# Patient Record
Sex: Male | Born: 1990 | Race: White | Hispanic: No | Marital: Married | Smoking: Never smoker
Health system: Southern US, Community
[De-identification: ages and names within clinical notes are randomized; demographics above are authoritative.]

## PROBLEM LIST (undated history)

## (undated) DIAGNOSIS — K6289 Other specified diseases of anus and rectum: Secondary | ICD-10-CM

## (undated) DIAGNOSIS — J45909 Unspecified asthma, uncomplicated: Secondary | ICD-10-CM

## (undated) DIAGNOSIS — K625 Hemorrhage of anus and rectum: Secondary | ICD-10-CM

## (undated) HISTORY — PX: ADENOIDECTOMY: SUR15

## (undated) HISTORY — PX: ANKLE SURGERY: SHX546

---
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
## 2008-06-23 ENCOUNTER — Encounter (INDEPENDENT_AMBULATORY_CARE_PROVIDER_SITE_OTHER): Payer: Self-pay | Admitting: *Deleted

## 2008-06-23 ENCOUNTER — Ambulatory Visit: Payer: Self-pay | Admitting: Internal Medicine

## 2008-06-23 ENCOUNTER — Observation Stay (HOSPITAL_COMMUNITY): Admission: AD | Admit: 2008-06-23 | Discharge: 2008-06-24 | Payer: Self-pay | Admitting: Internal Medicine

## 2008-07-01 ENCOUNTER — Telehealth: Payer: Self-pay | Admitting: Internal Medicine

## 2008-07-02 ENCOUNTER — Encounter: Payer: Self-pay | Admitting: Internal Medicine

## 2009-12-29 ENCOUNTER — Ambulatory Visit: Payer: Self-pay | Admitting: Internal Medicine

## 2009-12-29 DIAGNOSIS — R5383 Other fatigue: Secondary | ICD-10-CM

## 2009-12-29 DIAGNOSIS — R040 Epistaxis: Secondary | ICD-10-CM | POA: Insufficient documentation

## 2009-12-29 DIAGNOSIS — R5381 Other malaise: Secondary | ICD-10-CM | POA: Insufficient documentation

## 2009-12-29 DIAGNOSIS — K921 Melena: Secondary | ICD-10-CM | POA: Insufficient documentation

## 2009-12-30 ENCOUNTER — Encounter (INDEPENDENT_AMBULATORY_CARE_PROVIDER_SITE_OTHER): Payer: Self-pay | Admitting: *Deleted

## 2009-12-30 LAB — CONVERTED CEMR LAB
Albumin: 4.7 g/dL (ref 3.5–5.2)
Alkaline Phosphatase: 48 units/L (ref 39–117)
Basophils Relative: 0.5 % (ref 0.0–3.0)
Bilirubin, Direct: 0.1 mg/dL (ref 0.0–0.3)
CO2: 31 meq/L (ref 19–32)
Calcium: 9.7 mg/dL (ref 8.4–10.5)
Chloride: 103 meq/L (ref 96–112)
Eosinophils Absolute: 0.1 10*3/uL (ref 0.0–0.7)
Eosinophils Relative: 2.6 % (ref 0.0–5.0)
Glucose, Bld: 97 mg/dL (ref 70–99)
HCT: 42.3 % (ref 39.0–52.0)
Hemoglobin, Urine: NEGATIVE
Ketones, ur: NEGATIVE mg/dL
Leukocytes, UA: NEGATIVE
Lymphocytes Relative: 40.4 % (ref 12.0–46.0)
MCHC: 34.5 g/dL (ref 30.0–36.0)
MCV: 91.1 fL (ref 78.0–100.0)
Monocytes Relative: 5.8 % (ref 3.0–12.0)
Neutro Abs: 2.1 10*3/uL (ref 1.4–7.7)
Platelets: 158 10*3/uL (ref 150.0–400.0)
Potassium: 4.3 meq/L (ref 3.5–5.1)
Sed Rate: 5 mm/hr (ref 0–22)
TSH: 1.66 microintl units/mL (ref 0.35–5.50)
Total Bilirubin: 1.1 mg/dL (ref 0.3–1.2)
Transferrin: 269.4 mg/dL (ref 212.0–360.0)
Vit D, 25-Hydroxy: 27 ng/mL — ABNORMAL LOW (ref 30–89)
Vitamin B-12: 343 pg/mL (ref 211–911)
WBC: 4.1 10*3/uL — ABNORMAL LOW (ref 4.5–10.5)
aPTT: 39.9 s — ABNORMAL HIGH (ref 21.7–28.8)

## 2009-12-31 ENCOUNTER — Encounter: Payer: Self-pay | Admitting: Internal Medicine

## 2009-12-31 ENCOUNTER — Ambulatory Visit: Payer: Self-pay | Admitting: Internal Medicine

## 2009-12-31 LAB — CONVERTED CEMR LAB: INR: 1.1 — ABNORMAL HIGH (ref 0.8–1.0)

## 2010-01-01 ENCOUNTER — Telehealth: Payer: Self-pay | Admitting: Internal Medicine

## 2010-01-04 ENCOUNTER — Telehealth: Payer: Self-pay | Admitting: Internal Medicine

## 2010-01-05 ENCOUNTER — Ambulatory Visit: Payer: Self-pay | Admitting: Oncology

## 2010-01-06 ENCOUNTER — Ambulatory Visit: Payer: Self-pay | Admitting: Internal Medicine

## 2010-01-06 DIAGNOSIS — G4700 Insomnia, unspecified: Secondary | ICD-10-CM | POA: Insufficient documentation

## 2010-01-06 DIAGNOSIS — R55 Syncope and collapse: Secondary | ICD-10-CM | POA: Insufficient documentation

## 2010-01-21 ENCOUNTER — Encounter: Payer: Self-pay | Admitting: Internal Medicine

## 2010-01-21 LAB — CBC WITH DIFFERENTIAL/PLATELET
HCT: 42.4 % (ref 38.4–49.9)
HGB: 14.8 g/dL (ref 13.0–17.1)
MCH: 31.4 pg (ref 27.2–33.4)
MONO%: 7.9 % (ref 0.0–14.0)
RDW: 12.7 % (ref 11.0–14.6)
WBC: 4.3 10*3/uL (ref 4.0–10.3)
lymph#: 1.8 10*3/uL (ref 0.9–3.3)

## 2010-01-21 LAB — MORPHOLOGY: PLT EST: ADEQUATE

## 2010-01-22 LAB — MONONUCLEOSIS SCREEN: Mono Screen: NEGATIVE

## 2010-01-26 LAB — COMPREHENSIVE METABOLIC PANEL
Alkaline Phosphatase: 57 U/L (ref 39–117)
BUN: 13 mg/dL (ref 6–23)
CO2: 21 mEq/L (ref 19–32)
Chloride: 105 mEq/L (ref 96–112)
Glucose, Bld: 94 mg/dL (ref 70–99)
Potassium: 4.1 mEq/L (ref 3.5–5.3)
Sodium: 140 mEq/L (ref 135–145)
Total Bilirubin: 0.9 mg/dL (ref 0.3–1.2)

## 2010-01-26 LAB — PTT FACTOR INHIBITOR (MIXING STUDY): PTT: 31 seconds (ref 24–37)

## 2010-01-26 LAB — FIBRINOGEN: Fibrinogen: 219 mg/dL (ref 204–475)

## 2010-01-26 LAB — THROMBIN TIME: THROMBIN TIME: 20 s (ref 16–23)

## 2010-02-22 ENCOUNTER — Telehealth: Payer: Self-pay | Admitting: Internal Medicine

## 2010-02-24 ENCOUNTER — Encounter: Payer: Self-pay | Admitting: Internal Medicine

## 2010-09-28 NOTE — Letter (Signed)
Summary: [facility] Consult Scheduled Letter  [facility]  [address] [place], [place] [address]   Phone: [phone]  Fax: [phone]      12/30/2009 MRN: [id number]  [name] [name] [address] [place], [place]  [address]    Dear [name],      We have scheduled an appointment for you.  At the recommendation of Dr.Plotnikov, we have scheduled you a consult with Dr Marina Goodell on 01/27/10 at 1:45pm.  Their phone number is [phone].  If this appointment day and time is not convenient for you, please feel free to call the office of the doctor you are being referred to at the number listed above and reschedule the appointment.     [facility] [address] [place], [place] [address] *Gastroenterology Dept.3rd Floor*    Please give 24hr notice if you need to cancel/reschedule to avoid a $50.00 fee.Also bring insurance card and any co-pay due at time of visit.    Thank you,  Patient Care Coordinator [facility]

## 2010-09-28 NOTE — Letter (Signed)
Summary: [employer]  [employer]   Imported By: Sherian Rein 01/12/2010 07:54:06  _____________________________________________________________________  External Attachment:    Type:   Image     Comment:   External Document

## 2010-09-28 NOTE — Assessment & Plan Note (Signed)
Summary: rectal bleeding, abd pain/#/cd   Vital Signs:  Patient profile:   20 year old male Height:      71 inches Weight:      162.38 pounds (73.81 kg) BMI:     22.73 O2 Sat:      98 % on Room air Temp:     98.4 degrees F (36.89 degrees C) oral Pulse rate:   52 / minute BP sitting:   112 / 72  (left arm) Cuff size:   regular  Vitals Entered By: Brenton Grills (Dec 29, 2009 10:46 AM)  O2 Flow:  Room air CC: pt c/o rectal bleeding, abdominal pain, chest pain, HA and earaches x 32months/pt also c/o nosebleeds that vary/aj   CC:  pt c/o rectal bleeding, abdominal pain, chest pain, and HA and earaches x 103months/pt also c/o nosebleeds that vary/aj.  History of Present Illness: C/o HAs and ear pain, nose bleeds, stomach hurts  x 2 wks BMs after each meal, mixed w/blood x 6-7 months No wt  loss  C/p weakness, cold sweats  Current Medications (verified): 1)  None  Allergies (verified): 1)  ! Penicillin 2)  ! Benadryl 3)  ! Ceclor 4)  ! * Advair  Past History:  Past Medical History: Last updated: 05/24/2007 Allergic Rhinitis Asthma  Past Surgical History: Last updated: 06/23/2008 Tonsillectomy  Social History: Last updated: 12/29/2009 Never Smoked Alcohol use-no Drug use-no Regular exercise-yes Occupation: Designer, fashion/clothing  Social History: Never Smoked Alcohol use-no Drug use-no Regular exercise-yes Occupation: Designer, fashion/clothing  Review of Systems       The patient complains of melena, severe indigestion/heartburn, genital sores, muscle weakness, suspicious skin lesions, difficulty walking, depression, and unusual weight change.  The patient denies fever, weight loss, chest pain, dyspnea on exertion, abdominal pain, and hematochezia.    Physical Exam  General:  alert, well-developed, well-nourished, and well-hydrated.   Nose:  Eryth mucosa w/yellow crusts Mouth:  Oral mucosa and oropharynx without lesions or exudates.  Teeth in good repair. Neck:  No deformities, masses,  or tenderness noted. Lungs:  Normal respiratory effort, chest expands symmetrically. Lungs are clear to auscultation, no crackles or wheezes. Heart:  Normal rate and regular rhythm. S1 and S2 normal without gallop, murmur, click, rub or other extra sounds. Abdomen:  soft, non-tender, no masses, no hepatomegaly, and no splenomegaly.   Rectal:  Def to GI Msk:  No deformity or scoliosis noted of thoracic or lumbar spine.   Pulses:  R and L carotid,radial,femoral,dorsalis pedis and posterior tibial pulses are full and equal bilaterally Extremities:  No clubbing, cyanosis, edema, or deformity noted with normal full range of motion of all joints.   Neurologic:  No cranial nerve deficits noted. Station and gait are normal. Plantar reflexes are down-going bilaterally. DTRs are symmetrical throughout. Sensory, motor and coordinative functions appear intact. Skin:  Intact without suspicious lesions or rashes Cervical Nodes:  No lymphadenopathy noted Inguinal Nodes:  No significant adenopathy Psych:  Cognition and judgment appear intact. Alert and cooperative with normal attention span and concentration. No apparent delusions, illusions, hallucinations   Impression & Recommendations:  Problem # 1:  FATIGUE (ICD-780.79) Assessment New  Orders: T-Vitamin D (25-Hydroxy) (98119-14782) T-2 View CXR, Same Day (71020.5TC) TLB-PT (Protime) (85610-PTP) TLB-B12, Serum-Total ONLY (95621-H08) TLB-BMP (Basic Metabolic Panel-BMET) (80048-METABOL) TLB-CBC Platelet - w/Differential (85025-CBCD) TLB-Hepatic/Liver Function Pnl (80076-HEPATIC) TLB-IBC Pnl (Iron/FE;Transferrin) (83550-IBC) TLB-Sedimentation Rate (ESR) (85652-ESR) TLB-TSH (Thyroid Stimulating Hormone) (84443-TSH) TLB-Udip ONLY (81003-UDIP) TLB-PTT (85730-PTTL)  Problem # 2:  EPISTAXIS (ICD-784.7)- poss  sinusitis Assessment: New Zpac Orders: T-Vitamin D (25-Hydroxy) (16109-60454) T-2 View CXR, Same Day (71020.5TC) TLB-B12, Serum-Total ONLY  (09811-B14) TLB-BMP (Basic Metabolic Panel-BMET) (80048-METABOL) TLB-CBC Platelet - w/Differential (85025-CBCD) TLB-Hepatic/Liver Function Pnl (80076-HEPATIC) TLB-IBC Pnl (Iron/FE;Transferrin) (83550-IBC) TLB-Sedimentation Rate (ESR) (85652-ESR) TLB-TSH (Thyroid Stimulating Hormone) (84443-TSH) TLB-Udip ONLY (81003-UDIP) TLB-PTT (85730-PTTL)  Problem # 3:  HEMATOCHEZIA (ICD-578.1) Assessment: New R/o IBD Orders: T-Vitamin D (25-Hydroxy) (78295-62130) T-2 View CXR, Same Day (71020.5TC) Gastroenterology Referral (GI) TLB-B12, Serum-Total ONLY (86578-I69) TLB-BMP (Basic Metabolic Panel-BMET) (80048-METABOL) TLB-CBC Platelet - w/Differential (85025-CBCD) TLB-Hepatic/Liver Function Pnl (80076-HEPATIC) TLB-IBC Pnl (Iron/FE;Transferrin) (83550-IBC) TLB-Sedimentation Rate (ESR) (85652-ESR) TLB-TSH (Thyroid Stimulating Hormone) (84443-TSH) TLB-Udip ONLY (81003-UDIP) TLB-PTT (85730-PTTL)  Problem # 4:  Sweats Assessment: New Labs ordered  Complete Medication List: 1)  Anusol-hc 25 Mg Supp (Hydrocortisone acetate) .Marland Kitchen.. 1 pr two times a day for hemorrhoids 2)  Vitamin D 1000 Unit Tabs (Cholecalciferol) .Marland Kitchen.. 1 by mouth qd 3)  Zithromax Z-pak 250 Mg Tabs (Azithromycin) .... As dirrected  Patient Instructions: 1)  Please schedule a follow-up appointment in 2 months. Prescriptions: ZITHROMAX Z-PAK 250 MG TABS (AZITHROMYCIN) as dirrected  #1 x 0   Entered and Authorized by:   Tresa Garter MD   Signed by:   Tresa Garter MD on 12/29/2009   Method used:   Print then Give to Patient   RxID:   [id number] ANUSOL-HC 25 MG SUPP (HYDROCORTISONE ACETATE) 1 pr two times a day for hemorrhoids  #20 x 3   Entered and Authorized by:   Tresa Garter MD   Signed by:   Tresa Garter MD on 12/29/2009   Method used:   Print then Give to Patient   RxID:   [id number]

## 2010-09-28 NOTE — Progress Notes (Signed)
Summary: results  Phone Note Call from Patient Call back at [phone]   Caller: Mother-Tammy Summary of Call: Patient mother is calling for test results. Please advise. Initial call taken by: Lucious Groves,  Jan 01, 2010 3:03 PM  Follow-up for Phone Call        one was abnormal, one was normal, two are pending, the low level is fibrinogen and IF he is bleeding this may need to be corrected in the hospital with a transfusion of FFP Follow-up by: Etta Grandchild MD,  Jan 01, 2010 3:08 PM  Additional Follow-up for Phone Call Additional follow up Details #1::        Patient mother notified that tests are still pending and if he is bleeding he would need to go to hosp for transfusion. Additional Follow-up by: Lucious Groves,  Jan 01, 2010 4:15 PM

## 2010-09-28 NOTE — Assessment & Plan Note (Signed)
Summary: f/u labs results/continued dizzyness /SD   Vital Signs:  Patient profile:   20 year old male Height:      71 inches Weight:      160.50 pounds BMI:     22.47 O2 Sat:      98 % on Room air Temp:     97.8 degrees F oral Pulse rate:   58 / minute BP supine:   120 / 70 BP standing:   120 / 70 Cuff size:   regular  Vitals Entered By: Lucious Groves (Jan 06, 2010 8:46 AM)  O2 Flow:  Room air CC: F/U--Discuss lab results and continue dizziness./kb Is Patient Diabetic? No Pain Assessment Patient in pain? no        CC:  F/U--Discuss lab results and continue dizziness./kb.  History of Present Illness: C/o an episode of almost passing out yesturday at work 9 am: he felt weak and lightheaded and went down on his knees. He has been tired and lightheaded x 3 wks. No CP, SOB. He does not eat breakfast. His bleedings have stopped after his previous visit. C/o insomnia x 3 months - gets 2 h per night max; not depressed, stressed w/his girlfriend...  Current Medications (verified): 1)  None  Allergies (verified): 1)  ! Penicillin 2)  ! Benadryl 3)  ! Ceclor 4)  ! * Advair  Past History:  Past Medical History: Last updated: 05/24/2007 Allergic Rhinitis Asthma  Social History: Never Smoked Alcohol use-no Drug use-no Regular exercise-yes Occupation: textiles Single  Review of Systems  The patient denies fever, chest pain, dyspnea on exertion, peripheral edema, abdominal pain, melena, hematochezia, muscle weakness, difficulty walking, depression, and unusual weight change.    Physical Exam  General:  Well-developed,well-nourished,in no acute distress; alert,appropriate and cooperative throughout examination Head:  Normocephalic and atraumatic without obvious abnormalities. No apparent alopecia or balding. Nose:  Eryth mucosa w/yellow crusts Mouth:  Oral mucosa and oropharynx without lesions or exudates.  Teeth in good repair. Neck:  No deformities, masses, or  tenderness noted. Lungs:  Normal respiratory effort, chest expands symmetrically. Lungs are clear to auscultation, no crackles or wheezes. Heart:  Normal rate and regular rhythm. S1 and S2 normal without gallop, murmur, click, rub or other extra sounds. Abdomen:  soft, non-tender, no masses, no hepatomegaly, and no splenomegaly.   Msk:  No deformity or scoliosis noted of thoracic or lumbar spine.   Pulses:  R and L carotid,radial,femoral,dorsalis pedis and posterior tibial pulses are full and equal bilaterally Extremities:  No clubbing, cyanosis, edema, or deformity noted with normal full range of motion of all joints.   Neurologic:  No cranial nerve deficits noted. Station and gait are normal. Plantar reflexes are down-going bilaterally. DTRs are symmetrical throughout. Sensory, motor and coordinative functions appear intact. Skin:  Intact without suspicious lesions or rashes Psych:  Cognition and judgment appear intact. Alert and cooperative with normal attention span and concentration. No apparent delusions, illusions, hallucinationsnot suicidal.     Impression & Recommendations:  Problem # 1:  SYNCOPE (ICD-780.2) - near Assessment New Treat #2 Orders: EKG w/ Interpretation (93000) S brady  Problem # 2:  INSOMNIA, CHRONIC (ICD-307.42) Assessment: Deteriorated See "Patient Instructions".   Problem # 3:  EPISTAXIS (ICD-784.7) - stopped Assessment: Comment Only I'm not sure of etiology...  The labs were reviewed with the patient. He denies incidental ingestions  of potential poisons, etc.  ?autoimmune reaction... Hem consult is pending   Problem # 4:  HEMATOCHEZIA (ICD-578.1) - stopped  Assessment: Comment Only As above  Complete Medication List: 1)  Vitamin D 1000 Unit Tabs (Cholecalciferol) .Marland Kitchen.. 1 by mouth qd 2)  Zolpidem Tartrate 10 Mg Tabs (Zolpidem tartrate) .... 1/2-1 tab at bedtime as needed insomnia  Patient Instructions: 1)  Eat small breakfast, drink coffee before  work and at 10 am 2)  Get more sleep 3)  Call if you are not well 4)  Keep return office visit  Prescriptions: ZOLPIDEM TARTRATE 10 MG TABS (ZOLPIDEM TARTRATE) 1/2-1 tab at bedtime as needed insomnia  #30 x 2   Entered and Authorized by:   Tresa Garter MD   Signed by:   Tresa Garter MD on 01/06/2010   Method used:   Print then Give to Patient   RxID:   [id number]

## 2010-09-28 NOTE — Progress Notes (Signed)
Summary: results  Phone Note Call from Patient Call back at Home Phone [phone] Call back at or [phone]   Caller: Patient Call For: Etta Grandchild MD Summary of Call: Pt would like results of labs when they are available. Initial call taken by: Verdell Face,  Jan 04, 2010 3:12 PM  Follow-up for Phone Call        please send this to Dr. Demetrius Charity Follow-up by: Etta Grandchild MD,  Jan 04, 2010 3:23 PM  Additional Follow-up for Phone Call Additional follow up Details #1::        Mother called again, Pt is c/o dizzyness. Please advise.  Additional Follow-up by: Lamar Sprinkles, CMA,  Jan 05, 2010 10:30 AM    Additional Follow-up for Phone Call Additional follow up Details #2::    I called and left a vm with a notion that we'll be making a Hematol appt Follow-up by: Tresa Garter MD,  Jan 05, 2010 1:13 PM  Additional Follow-up for Phone Call Additional follow up Details #3:: Details for Additional Follow-up Action Taken: Spoke with pt's mother, She is concerned b/c pt c/o continued dizzyness and "blacked out" at work. I spoke w/pt and Dr Posey Rea. Pt c/o seeing "stars" when he bends over to pick up things at work. He will call office for new symptoms. Scheduled for office visit tomorrow. ................Marland KitchenLamar Sprinkles, CMA  Jan 05, 2010 3:10 PM  Agree. AP Additional Follow-up by: Tresa Garter MD,  Jan 05, 2010 4:45 PM

## 2010-09-28 NOTE — Progress Notes (Signed)
Summary: Company secretary - Asthma letter  Phone Note Call from Patient Call back at [employer] [phone] Call back at [phone]   Summary of Call: Patient is requesting a letter from MD. He is trying to enlist into the [employer]. He needs a letter stating that his asthma was a childhood illness and he is not affected by this now. MD is out of the office until monday. I left vm for pt to find out if he has a deadline for this.  Initial call taken by: Lamar Sprinkles, CMA,  February 22, 2010 4:06 PM  Follow-up for Phone Call        Spoke w/pt, He is aware MD is out of the office and would like to wait until MD returns for this letter.  Follow-up by: Lamar Sprinkles, CMA,  February 22, 2010 4:19 PM  Additional Follow-up for Phone Call Additional follow up Details #1::        ok Additional Follow-up by: Tresa Garter MD,  February 24, 2010 9:38 AM    Additional Follow-up for Phone Call Additional follow up Details #2::    letter stampted, and left vm for pt to pick up letter Follow-up by: Lamar Sprinkles, CMA,  February 24, 2010 10:09 AM

## 2010-09-28 NOTE — Letter (Signed)
[facility]   Imported By: Sherian Rein 03/03/2010 09:42:05  _____________________________________________________________________  External Attachment:    Type:   Image     Comment:   External Document

## 2010-09-28 NOTE — Letter (Signed)
Summary: Generic Letter  [facility]  [address] [place], [place] [address]   Phone: [phone]  Fax: [phone]    02/24/2010  BM:[id number] [address] [place], [place]  [address]    Mr. [name] [name] has a h/o childhood asthma that has resolved. There has been no asthmatic episodes since 2007.           Sincerely,   Jacinta Shoe MD

## 2011-01-14 NOTE — Assessment & Plan Note (Signed)
[facility]                             PRIMARY CARE OFFICE NOTE   NAME:[name] [name] SAFLEY                    MRN:          [id number]  DATE:05/10/2006                            DOB:          11-Mar-1991    The patient is a 20 year old male who presents with his mother for a  wellness examination.   ALLERGIES:  PENICILLIN, BENADRYL, CECLOR, ADVAIR.  LATELY, ALBUTEROL STARTED  TO MAKE HIM SHAKY AND DIZZY.   PAST MEDICAL HISTORY:  1. There is a history of asthma, severe in childhood, much better now.  2. Allergic rhinitis.  3. Torn ligament, left ankle, status post surgical repair.   CURRENT MEDICATIONS:  Allegra p.r.n.   SOCIAL HISTORY:  He is doing 11th grade a half day at home and a half day at  school.  He plays basketball and baseball.  He does not smoke.   FAMILY HISTORY:  Positive for asthma.   REVIEW OF SYSTEMS:  No chest pain.  No abdominal pain.  No emotional  problems.   PHYSICAL EXAMINATION:  GENERAL:  He looks well and is in no acute distress.  HEENT:  With moist mucosa.  VITAL SIGNS:  Blood pressure 119/59, pulse 57, temperature 97.6, weight 149  pounds, height 5 feet 10.75 inches.  NECK:  Supple.  No thyromegaly or bruit.  LUNGS:  Clear.  No wheeze or rales.  HEART:  No murmur or gallop.  ABDOMEN:  Soft, nontender.  No mass felt.  EXTREMITIES:  Lower extremities without edema.  NEUROLOGIC:  He is alert, oriented, and cooperative.  He denies being  depressed.  SKIN:  Clear.   ASSESSMENT AND PLAN:  1. Normal wellness examination.  Age/health-related issues discussed.      Lifestyle discussed.  No restrictions for basketball/baseball.  2. Asthma, well-controlled.  Will try Xopenex HFA two inhalations before      and after the game as needed.  3. Occasional gastroesophageal reflux disease.  Tums p.r.n.  4. Status post left ankle ligament repair.  He will use Ace wrap for a      brace for games.  5. Allergic rhinitis.   Allegra 180 daily as needed.                                    [name] Quint. Plotnikov, MD   AVP/MedQ  DD:  05/11/2006  DT:  05/12/2006  Job #:  [id number]

## 2011-05-31 LAB — COMPREHENSIVE METABOLIC PANEL
ALT: 17
BUN: 12
CO2: 29
Chloride: 103
GFR calc Af Amer: >60
Glucose, Bld: 103 — ABNORMAL HIGH
Potassium: 4.3
Total Bilirubin: 1.1
Total Protein: 6.8

## 2011-05-31 LAB — URINALYSIS, ROUTINE W REFLEX MICROSCOPIC
Glucose, UA: NEGATIVE
Hgb urine dipstick: NEGATIVE
Ketones, ur: NEGATIVE
Nitrite: NEGATIVE
Protein, ur: NEGATIVE
Urobilinogen, UA: 0.2

## 2011-06-09 LAB — URINE CULTURE
Colony Count: NO GROWTH
Culture: NO GROWTH

## 2011-06-09 LAB — URINALYSIS, ROUTINE W REFLEX MICROSCOPIC
Bilirubin Urine: NEGATIVE
Ketones, ur: NEGATIVE
Nitrite: NEGATIVE
pH: 5.5

## 2012-02-29 ENCOUNTER — Emergency Department (HOSPITAL_COMMUNITY)
Admission: EM | Admit: 2012-02-29 | Discharge: 2012-03-01 | Disposition: A | Discharge status: Home / Self Care | Payer: Self-pay | Attending: Emergency Medicine | Admitting: Emergency Medicine

## 2012-02-29 ENCOUNTER — Encounter (HOSPITAL_COMMUNITY): Payer: Self-pay | Admitting: *Deleted

## 2012-02-29 DIAGNOSIS — K6289 Other specified diseases of anus and rectum: Secondary | ICD-10-CM | POA: Insufficient documentation

## 2012-02-29 DIAGNOSIS — Z888 Allergy status to other drugs, medicaments and biological substances status: Secondary | ICD-10-CM | POA: Insufficient documentation

## 2012-02-29 DIAGNOSIS — K625 Hemorrhage of anus and rectum: Secondary | ICD-10-CM | POA: Insufficient documentation

## 2012-02-29 DIAGNOSIS — Z880 Allergy status to penicillin: Secondary | ICD-10-CM | POA: Insufficient documentation

## 2012-02-29 HISTORY — DX: Unspecified asthma, uncomplicated: J45.909

## 2012-02-29 LAB — CBC WITH DIFFERENTIAL/PLATELET
HCT: 43.3 % (ref 39.0–52.0)
MCH: 31.1 pg (ref 26.0–34.0)
MCHC: 34.9 g/dL (ref 30.0–36.0)
Monocytes Absolute: 0.4 10*3/uL (ref 0.1–1.0)
Neutro Abs: 2.6 10*3/uL (ref 1.7–7.7)
Platelets: 165 10*3/uL (ref 150–400)
RDW: 11.9 % (ref 11.5–15.5)
WBC: 5.4 10*3/uL (ref 4.0–10.5)

## 2012-02-29 LAB — BASIC METABOLIC PANEL
CO2: 29 mEq/L (ref 19–32)
Chloride: 102 mEq/L (ref 96–112)
Glucose, Bld: 94 mg/dL (ref 70–99)

## 2012-02-29 MED ORDER — OXYCODONE-ACETAMINOPHEN 5-325 MG PO TABS
1.0000 | ORAL_TABLET | Freq: Once | ORAL | Status: AC
  Administered 2012-02-29: 1 via ORAL
  Filled 2012-02-29: qty 1

## 2012-02-29 NOTE — ED Notes (Signed)
Pt is here for rectal bleeding.  Pt states that the bleeding began 8-9 months ago and pt was seen at the health department and told that he should be seen by a specialist but pt has been unable to follow up with this.  Pt also has been having rectal pain and pain with sitting.  Pt states that there is "a lot of bleeding" but blood counts have been normal when he was checked in the past. Intermittent general weakness and dizziness.

## 2012-02-29 NOTE — ED Provider Notes (Signed)
History     CSN: [id number]  Arrival date & time 02/29/12  2038   First MD Initiated Contact with Patient 02/29/12 2247      Chief Complaint  Patient presents with  . Rectal Bleeding    (Consider location/radiation/quality/duration/timing/severity/associated sxs/prior treatment) HPI Comments: Patient is a 21 year old male with no significant past medical history that presents emergency department with chief complaint of rectal bleeding and rectal pain.  Patient has been suffering from this intermittently beginning back around 8 or 9 months ago.  Patient states that he saw someone from the health Department about 4 months ago and was diagnosed with a cyst and recommended to followup with a specialist, however he reports that he's been trying to get appropriate paperwork and Medicaid however he has been unable to finance the specialist.  Patient denies any melena, nausea, vomiting, abdominal pain, fever, night sweats, chills, lightheadedness, syncope, easy. bruising.  Extreme pain with defecation and occasional gross blood.  Patient has no other complaints at this time.  Patient is a 21 y.o. male presenting with hematochezia. The history is provided by the patient.  Rectal Bleeding     Past Medical History  Diagnosis Date  . Asthma     Past Surgical History  Procedure Date  . Adenoidectomy   . Ankle surgery     No family history on file.  History  Substance Use Topics  . Smoking status: Not on file  . Smokeless tobacco: Not on file  . Alcohol Use: No      Review of Systems  Gastrointestinal: Positive for hematochezia.  All other systems reviewed and are negative.    Allergies  Cefaclor; Diphenhydramine hcl; Fluticasone-salmeterol; and Penicillins  Home Medications  No current outpatient prescriptions on file.  BP 111/53  Pulse 60  Temp 98.8 F (37.1 C) (Oral)  Resp 16  SpO2 98%  Physical Exam  Nursing note and vitals reviewed. Constitutional: He is  oriented to person, place, and time. He appears well-developed and well-nourished. No distress.  HENT:  Head: Normocephalic and atraumatic.  Eyes: Conjunctivae and EOM are normal.  Neck: Normal range of motion.  Pulmonary/Chest: Effort normal.  Genitourinary: Guaiac negative stool.       Chaperone was present. Patient with extreme pain during rectal exam. External ttp left of the anus.There are no external fissures noted. No induration of the skin or swelling. No external hemorrhoids seen. I was able to feel the first 2-3cm of the rectum digitally, palpable skin abnormality at 9:00 that was ttp without fluctuance. There is no gross blood.  No signs of perirectal abscess.    Musculoskeletal: Normal range of motion.  Neurological: He is alert and oriented to person, place, and time.  Skin: Skin is warm and dry. No rash noted. He is not diaphoretic.  Psychiatric: He has a normal mood and affect. His behavior is normal.    ED Course  Procedures (including critical care time)  Labs Reviewed  BASIC METABOLIC PANEL - Abnormal; Notable for the following:    Creatinine, Ser 1.78 (*)     GFR calc non Af Amer 53 (*)     GFR calc Af Amer 61 (*)     All other components within normal limits  CBC WITH DIFFERENTIAL   No results found.   No diagnosis found.    MDM  Rectal pain/bleeding  Patient advised again to followup with GI for further evaluation of rectal pain and bleeding.  Advised to take with stool softeners in  stool bulking agents (Colace, Metamucil).  Patient with normal vitals in no acute distress prior to discharge.        Jaci Carrel, [place] 03/01/12 [phone]

## 2012-03-01 MED ORDER — PSYLLIUM 0.52 G PO CAPS: 0.5200 g | ORAL_CAPSULE | Freq: Every day | ORAL | Status: DC

## 2012-03-01 MED ORDER — DOCUSATE SODIUM 100 MG PO CAPS: 100.0000 mg | ORAL_CAPSULE | Freq: Two times a day (BID) | ORAL | Status: AC

## 2012-03-01 MED ORDER — LIDOCAINE 5 % EX OINT
TOPICAL_OINTMENT | Freq: Once | CUTANEOUS | Status: DC
  Filled 2012-03-01: qty 35.44

## 2012-03-01 NOTE — ED Provider Notes (Signed)
History/physical exam/procedure(s) were performed by non-physician practitioner and as supervising physician I was immediately available for consultation/collaboration. I have reviewed all notes and am in agreement with care and plan.   Hilario Quarry, MD 03/01/12 [phone]

## 2012-03-15 ENCOUNTER — Other Ambulatory Visit (INDEPENDENT_AMBULATORY_CARE_PROVIDER_SITE_OTHER): Payer: Self-pay | Admitting: *Deleted

## 2012-03-15 ENCOUNTER — Ambulatory Visit (INDEPENDENT_AMBULATORY_CARE_PROVIDER_SITE_OTHER): Payer: Self-pay | Admitting: Internal Medicine

## 2012-03-15 ENCOUNTER — Encounter (INDEPENDENT_AMBULATORY_CARE_PROVIDER_SITE_OTHER): Payer: Self-pay | Admitting: Internal Medicine

## 2012-03-15 ENCOUNTER — Encounter (INDEPENDENT_AMBULATORY_CARE_PROVIDER_SITE_OTHER): Payer: Self-pay | Admitting: *Deleted

## 2012-03-15 VITALS — BP 100/60 | HR 76 | Temp 97.9°F | Ht 71.0 in | Wt 172.3 lb

## 2012-03-15 DIAGNOSIS — K6289 Other specified diseases of anus and rectum: Secondary | ICD-10-CM

## 2012-03-15 DIAGNOSIS — R6889 Other general symptoms and signs: Secondary | ICD-10-CM | POA: Insufficient documentation

## 2012-03-15 DIAGNOSIS — K625 Hemorrhage of anus and rectum: Secondary | ICD-10-CM | POA: Insufficient documentation

## 2012-03-15 NOTE — Patient Instructions (Addendum)
Flex sig tomorrow with sedation.

## 2012-03-15 NOTE — Progress Notes (Signed)
Subjective:     Patient ID: [name] [name], male   DOB: 07-15-1991, 21 y.o.   MRN: [id number]  HPI [name] [name] is a 21 yr old male here today after a recent admssion to the ED  (03/19/2012).  for rectal pain. He also had rectal bleeding.  He describes the blood as "alot"  He says that every times he has a BM he sees blood.  Symtoms x 8 or 9 months.  During rectal exam, an abnormality felt at 9:00.  There has been no weight loss. He has actually gained weight. He has rectal pain. No melena.  He has a BM about 3 times a day.  He gets up at night about 3 times to void.  He has pain when he has a BM.   Has tried Proctofoam but did not help.  OTC supp for hemorrhoids helped somewhat. CBC    Component Value Date/Time   WBC 5.4 02/29/2012 2112   WBC 4.3 01/21/2010 1213   RBC 4.85 02/29/2012 2112   RBC 4.71 01/21/2010 1213   HGB 15.1 02/29/2012 2112   HGB 14.8 01/21/2010 1213   HCT 43.3 02/29/2012 2112   HCT 42.4 01/21/2010 1213   PLT 165 02/29/2012 2112   PLT 150 01/21/2010 1213   MCV 89.3 02/29/2012 2112   MCV 90.1 01/21/2010 1213   MCH 31.1 02/29/2012 2112   MCH 31.4 01/21/2010 1213   MCHC 34.9 02/29/2012 2112   MCHC 34.8 01/21/2010 1213   RDW 11.9 02/29/2012 2112   RDW 12.7 01/21/2010 1213   LYMPHSABS 2.2 02/29/2012 2112   LYMPHSABS 1.8 01/21/2010 1213   MONOABS 0.4 02/29/2012 2112   MONOABS 0.3 01/21/2010 1213   EOSABS 0.3 02/29/2012 2112   EOSABS 0.1 01/21/2010 1213   BASOSABS 0.0 02/29/2012 2112   BASOSABS 0.0 01/21/2010 1213     Review of Systems see hpi Current Outpatient Prescriptions  Medication Sig Dispense Refill  . psyllium (METAMUCIL) 0.52 G capsule Take 1 capsule (0.52 g total) by mouth daily.  30 capsule  0   Past Medical History  Diagnosis Date  . Asthma   . Asthma    Past Surgical History  Procedure Date  . Adenoidectomy   . Ankle surgery    Family Status  Relation Status Death Age  . Mother Alive     good health  . Father Alive     hypertension  . Brother Alive     good health    History   Social History  . Marital Status: Married    Spouse Name: N/A    Number of Children: N/A  . Years of Education: N/A   Occupational History  . Not on file.   Social History Main Topics  . Smoking status: Never Smoker   . Smokeless tobacco: Not on file  . Alcohol Use: No  . Drug Use: Not on file  . Sexually Active: Not on file   Other Topics Concern  . Not on file   Social History Narrative  . No narrative on file   Allergies  Allergen Reactions  . Cefaclor   . Diphenhydramine Hcl   . Fluticasone-Salmeterol   . Penicillins         Objective:   Physical Exam Filed Vitals:   03/15/12 1529  Height: 5\' 11"  (1.803 m)  Weight: 172 lb 4.8 oz (78.155 kg)   Alert and oriented. Skin warm and dry. Oral mucosa is moist.   . Sclera anicteric, conjunctivae is pink. Thyroid  not enlarged. No cervical lymphadenopathy. Lungs clear. Heart regular rate and rhythm.  Abdomen is soft. Bowel sounds are positive. No hepatomegaly. No abdominal masses felt. No tenderness. Rectal exam: very painful for patient. Did not feel a mass  No stool to guaiac.  No edema to lower extremities.       Assessment:   Rectal bleeding, rectal pain. ?abnromal tissue on rectal exam per ED physician.  Possible rectal fissure. I discussed with Dr. Karilyn Cota.    Plan:    Flex sig with profonol.

## 2012-03-16 ENCOUNTER — Encounter (HOSPITAL_COMMUNITY): Payer: Self-pay | Admitting: *Deleted

## 2012-03-16 ENCOUNTER — Encounter (HOSPITAL_COMMUNITY)
Admission: RE | Disposition: A | Discharge status: Home Health | Payer: Self-pay | Source: Ambulatory Visit | Attending: Internal Medicine

## 2012-03-16 ENCOUNTER — Ambulatory Visit (HOSPITAL_COMMUNITY)
Admission: RE | Admit: 2012-03-16 | Discharge: 2012-03-16 | Disposition: A | Discharge status: Home Health | Payer: Self-pay | Source: Ambulatory Visit | Attending: Internal Medicine | Admitting: Internal Medicine

## 2012-03-16 DIAGNOSIS — K644 Residual hemorrhoidal skin tags: Secondary | ICD-10-CM

## 2012-03-16 DIAGNOSIS — K6289 Other specified diseases of anus and rectum: Secondary | ICD-10-CM

## 2012-03-16 DIAGNOSIS — K625 Hemorrhage of anus and rectum: Secondary | ICD-10-CM

## 2012-03-16 HISTORY — PX: FLEXIBLE SIGMOIDOSCOPY: SHX5431

## 2012-03-16 HISTORY — DX: Other specified diseases of anus and rectum: K62.89

## 2012-03-16 HISTORY — DX: Hemorrhage of anus and rectum: K62.5

## 2012-03-16 SURGERY — SIGMOIDOSCOPY, FLEXIBLE
Anesthesia: Moderate Sedation

## 2012-03-16 MED ORDER — LIDOCAINE HCL 2 % EX GEL
CUTANEOUS | Status: AC
  Filled 2012-03-16: qty 30

## 2012-03-16 MED ORDER — MEPERIDINE HCL 50 MG/ML IJ SOLN
INTRAMUSCULAR | Status: AC
  Filled 2012-03-16: qty 1

## 2012-03-16 MED ORDER — MIDAZOLAM HCL 5 MG/5ML IJ SOLN
INTRAMUSCULAR | Status: AC
  Filled 2012-03-16: qty 10

## 2012-03-16 MED ORDER — NYSTATIN-TRIAMCINOLONE 100000-0.1 UNIT/GM-% EX OINT: TOPICAL_OINTMENT | Freq: Two times a day (BID) | CUTANEOUS | Status: DC

## 2012-03-16 MED ORDER — MIDAZOLAM HCL 5 MG/5ML IJ SOLN
INTRAMUSCULAR | Status: DC | PRN
  Administered 2012-03-16 (×5): 2 mg via INTRAVENOUS

## 2012-03-16 MED ORDER — NITROGLYCERIN 0.4 % RE OINT: 0.5000 [in_us] | TOPICAL_OINTMENT | Freq: Two times a day (BID) | RECTAL | Status: DC

## 2012-03-16 MED ORDER — STERILE WATER FOR IRRIGATION IR SOLN
Status: DC | PRN
  Administered 2012-03-16: 12:00:00

## 2012-03-16 MED ORDER — LIDOCAINE 5 % EX OINT
TOPICAL_OINTMENT | CUTANEOUS | Status: DC | PRN
  Administered 2012-03-16: 2

## 2012-03-16 MED ORDER — MEPERIDINE HCL 25 MG/ML IJ SOLN
INTRAMUSCULAR | Status: DC | PRN
  Administered 2012-03-16 (×2): 25 mg via INTRAVENOUS

## 2012-03-16 MED ORDER — SODIUM CHLORIDE 0.45 % IV SOLN
Freq: Once | INTRAVENOUS | Status: AC
  Administered 2012-03-16: 12:00:00 via INTRAVENOUS

## 2012-03-16 MED ORDER — POLYETHYLENE GLYCOL 3350 17 G PO PACK: 17.0000 g | PACK | Freq: Every day | ORAL | Status: AC

## 2012-03-16 NOTE — Op Note (Signed)
FLEXIBLE SIGMOIDOSCOPY  PROCEDURE REPORT  PATIENT:  [name] [name]  MR#:  [id number] Birthdate:  1990-09-09, 21 y.o., male Endoscopist:  Dr. Malissa Hippo, MD Referred By:  Jaci Carrel, [facility] Procedure Date: 03/16/2012  Procedure: Flexible sigmoidoscopy.  Indications:  Patient is 21 year old Caucasian male with 10 month history of rectal pain, rectal bleeding and painful defecation.  Informed Consent:  The procedure and risks were reviewed with the patient and informed consent was obtained.  Medications:  Demerol 50 mg IV Versed 10 mg IV Xylocaine jelly for topical application anal canal.  Description of procedure:   Procedure performed in the endoscopy suite. Patient's vital signs and O2 sat were monitored during the procedure and remained stable. Patient was extremely anxious well sedated for the procedure. Patient was placed in left lateral common position. Rectal examination performed. No abnormality noted an external examination. Increased sphincter tone. Gastric BTO colonoscopethe rectum and advanced under vision and sigmoid colon a large amount of formed stool. Therefore examination is limited. Mucosa at distal sigmoid colon rectum as well as anorectal junction was carefully examined and scope was retroflexed.  Findings:   Sigmoid colon full of formed stool limited view of mucosa. Mucosa however was normal. Normal rectal mucosa. Noise below the dentate line with erythema but no ulcer or fissure noted.  Complications:  None  Impression:  No evidence of proctitis or rectal polyps. External hemorrhoids with inflammation but no fissure noted. Sigmoid colon full of formed stool.  Recommendations:  Nitroglycerin ointment 0.4% to be applied to anal canal twice a day. Mycolog-II ointment to be applied on the outside twice a day. Polyethylene glycol 17 g by mouth daily. Continue Metamucil. Progress report in one week. If symptoms persist he will need exam under  anesthesia.  Ebin Palazzi U  03/16/2012 12:32 PM  CC: Dr. No primary provider on file. & Dr. No ref. provider found

## 2012-03-16 NOTE — H&P (Signed)
[name] [name] is an 21 y.o. male.   Chief Complaint: [name] [name] is here for flexible sigmoidoscopy. HPI: [name] is 21 year old Caucasian male who presents with 10 month history of intermittent rectal pain and rectal bleeding. He said daily pain in source after bowel movement and pain may last all day. He denies diarrhea or constipation. Patient has been treated with topicals and antibiotics without symptomatic improvement. Patient was seen in emergency room 2 days ago and thought to have inflamed internal hemorrhoids. Patient has good appetite and that she gained few pounds. He does not take NSAIDs. Family history is negative for inflammatory bowel disease.  Past Medical History  Diagnosis Date  . Asthma   . Asthma   . Rectal pain   . Rectal bleeding     Past Surgical History  Procedure Date  . Adenoidectomy   . Ankle surgery     Family History  Problem Relation Age of Onset  . Colon cancer Paternal Uncle    Social History:  reports that he has never smoked. He does not have any smokeless tobacco history on file. He reports that he does not drink alcohol or use illicit drugs.  Allergies:  Allergies  Allergen Reactions  . Cefaclor   . Diphenhydramine Hcl   . Fluticasone-Salmeterol   . Penicillins     Medications Prior to Admission  Medication Sig Dispense Refill  . psyllium (METAMUCIL) 0.52 G capsule Take 1 capsule (0.52 g total) by mouth daily.  30 capsule  0    No results found for this or any previous visit (from the past 48 hour(s)). No results found.  ROS  Blood pressure 126/78, pulse 62, temperature 98.2 F (36.8 C), temperature source Oral, resp. rate 18, height 5\' 11"  (1.803 m), weight 172 lb (78.019 kg), SpO2 98.00%. Physical Exam  Constitutional: He appears well-developed and well-nourished.  HENT:  Mouth/Throat: Oropharynx is clear and moist.  Eyes: Conjunctivae are normal. No scleral icterus.  Neck: No thyromegaly present.  Cardiovascular: Normal rate,  regular rhythm and normal heart sounds.   No murmur heard. Respiratory: Effort normal.  GI: He exhibits no distension and no mass. There is no tenderness.  Musculoskeletal: He exhibits no edema.  Lymphadenopathy:    He has no cervical adenopathy.  Neurological: He is alert.  Skin: Skin is warm and dry.     Assessment/Plan Rectal pain and hematochezia. Flexible sigmoidoscopy.  [name] [name] U 03/16/2012, 12:01 PM

## 2012-03-22 ENCOUNTER — Encounter (HOSPITAL_COMMUNITY): Payer: Self-pay | Admitting: Internal Medicine

## 2013-01-02 ENCOUNTER — Emergency Department (HOSPITAL_COMMUNITY)
Admission: EM | Admit: 2013-01-02 | Discharge: 2013-01-02 | Disposition: A | Discharge status: Home / Self Care | Payer: Self-pay | Attending: Emergency Medicine | Admitting: Emergency Medicine

## 2013-01-02 ENCOUNTER — Emergency Department (HOSPITAL_COMMUNITY): Payer: Self-pay

## 2013-01-02 ENCOUNTER — Encounter (HOSPITAL_COMMUNITY): Payer: Self-pay | Admitting: Emergency Medicine

## 2013-01-02 DIAGNOSIS — Z880 Allergy status to penicillin: Secondary | ICD-10-CM | POA: Insufficient documentation

## 2013-01-02 DIAGNOSIS — R296 Repeated falls: Secondary | ICD-10-CM | POA: Insufficient documentation

## 2013-01-02 DIAGNOSIS — J45909 Unspecified asthma, uncomplicated: Secondary | ICD-10-CM | POA: Insufficient documentation

## 2013-01-02 DIAGNOSIS — Y9302 Activity, running: Secondary | ICD-10-CM | POA: Insufficient documentation

## 2013-01-02 DIAGNOSIS — Y929 Unspecified place or not applicable: Secondary | ICD-10-CM | POA: Insufficient documentation

## 2013-01-02 DIAGNOSIS — S93402A Sprain of unspecified ligament of left ankle, initial encounter: Secondary | ICD-10-CM

## 2013-01-02 DIAGNOSIS — Z8719 Personal history of other diseases of the digestive system: Secondary | ICD-10-CM | POA: Insufficient documentation

## 2013-01-02 DIAGNOSIS — S93409A Sprain of unspecified ligament of unspecified ankle, initial encounter: Secondary | ICD-10-CM | POA: Insufficient documentation

## 2013-01-02 MED ORDER — IBUPROFEN 400 MG PO TABS
800.0000 mg | ORAL_TABLET | Freq: Once | ORAL | Status: AC
  Administered 2013-01-02: 800 mg via ORAL

## 2013-01-02 MED ORDER — IBUPROFEN 400 MG PO TABS
800.0000 mg | ORAL_TABLET | Freq: Once | ORAL | Status: AC
  Filled 2013-01-02: qty 2

## 2013-01-02 NOTE — ED Notes (Signed)
MD at bedside./ PA

## 2013-01-02 NOTE — ED Notes (Signed)
Left foot pain since last night  Was running and landed on  Left ankle some swelling good pulse can wiggle toes

## 2013-01-02 NOTE — ED Notes (Signed)
Pt educated about walking with crutches

## 2013-01-02 NOTE — ED Notes (Signed)
Patient transported to X-ray 

## 2013-01-03 NOTE — ED Provider Notes (Signed)
History     CSN: [id number]  Arrival date & time 01/02/13  1138   First MD Initiated Contact with Patient 01/02/13 1301      Chief Complaint  Patient presents with  . Foot Pain    (Consider location/radiation/quality/duration/timing/severity/associated sxs/prior treatment) Patient is a 22 y.o. male presenting with lower extremity pain. The history is provided by the patient.  Foot Pain This is a new problem. The current episode started yesterday. The problem occurs constantly. The problem has been unchanged. Associated symptoms include arthralgias and joint swelling. Pertinent negatives include no numbness or weakness. The symptoms are aggravated by walking (worse with weight bearing, better with rest). He has tried ice and lying down for the symptoms. The treatment provided mild relief.    Past Medical History  Diagnosis Date  . Asthma   . Asthma   . Rectal pain   . Rectal bleeding     Past Surgical History  Procedure Laterality Date  . Adenoidectomy    . Ankle surgery    . Flexible sigmoidoscopy  03/16/2012    Procedure: FLEXIBLE SIGMOIDOSCOPY;  Surgeon: Malissa Hippo, MD;  Location: [facility];  Service: Endoscopy;  Laterality: N/A;  1215    Family History  Problem Relation Age of Onset  . Colon cancer Paternal Uncle     History  Substance Use Topics  . Smoking status: Never Smoker   . Smokeless tobacco: Not on file  . Alcohol Use: No      Review of Systems  Musculoskeletal: Positive for joint swelling and arthralgias.       Left ankle pain, lateral side, some swelling and bruising  Neurological: Negative for weakness and numbness.    Allergies  Fluticasone-salmeterol; Cefaclor; Diphenhydramine hcl; and Penicillins  Home Medications   Current Outpatient Rx  Name  Route  Sig  Dispense  Refill  . ibuprofen (ADVIL,MOTRIN) 200 MG tablet   Oral   Take 200 mg by mouth every 6 (six) hours as needed for pain.           BP 126/76  Pulse 85   Temp(Src) 98.4 F (36.9 C) (Oral)  Resp 20  SpO2 96%  Physical Exam  Nursing note and vitals reviewed. Constitutional: He is oriented to person, place, and time. He appears well-developed and well-nourished. No distress.  HENT:  Head: Normocephalic and atraumatic.  Eyes: Conjunctivae and EOM are normal.  Neck: Normal range of motion. Neck supple.  No meningeal signs  Cardiovascular: Normal rate and intact distal pulses.   Pulmonary/Chest: Effort normal. No respiratory distress.  Abdominal: Soft. There is no tenderness.  Musculoskeletal: Normal range of motion. He exhibits no edema and no tenderness.  5/5 strength throughout. Mild swelling to left lateral malleolus. No erythema. No warmth. No effusion. Able to weight bear, though painful. Good pedal pulses  Neurological: He is alert and oriented to person, place, and time. No cranial nerve deficit.  Sensation to light touch intact, no focal deficits.   Skin: Skin is warm and dry. He is not diaphoretic. No erythema.  Psychiatric: He has a normal mood and affect.    ED Course  Procedures (including critical care time)  Labs Reviewed - No data to display Dg Foot Complete Left  01/02/2013  *RADIOLOGY REPORT*  Clinical Data: Pain and bruising in the left foot.  LEFT FOOT - COMPLETE 3+ VIEW  Comparison: None.  Findings: Three views of the left foot were obtained.  Alignment of the foot is normal.  No evidence for a fracture or dislocation. Mild joint space narrowing and degenerative changes at the first MTP joint.  IMPRESSION: No acute findings.   Original Report Authenticated By: Richarda Overlie, M.D.    Medications  ibuprofen (ADVIL,MOTRIN) tablet 800 mg (0 mg Oral Duplicate 01/02/13 1431)  ibuprofen (ADVIL,MOTRIN) tablet 800 mg (800 mg Oral Given 01/02/13 1421)      1. Ankle sprain, left, initial encounter       MDM  Imaging shows no fracture. Directed pt to ice injury, take acetaminophen or ibuprofen for pain, and to elevate and rest  the injury when possible. Provided ace wrap and crutches. Discussed follow up with both PCP and orthopedist if injury is not healing as expected. Pt understood and is in agreement with plan.    Glade Nurse, PA-C 01/03/13 1205

## 2013-01-06 NOTE — ED Provider Notes (Signed)
Medical screening examination/treatment/procedure(s) were performed by non-physician practitioner and as supervising physician I was immediately available for consultation/collaboration.  Donnetta Hutching, MD 01/06/13 [phone]

## 2014-09-29 ENCOUNTER — Telehealth: Payer: Self-pay | Admitting: Internal Medicine

## 2014-09-29 NOTE — Telephone Encounter (Signed)
Pt's mother called request [name] [name] to reestablish with Dr. Camila Li. Pt went away to college that's why he havent come for an appt. Pt request to be work in for sick, head hurt and sore throat. Please advise

## 2014-09-29 NOTE — Telephone Encounter (Signed)
Pt will call back to make an appt, went to urgent care of this problem already but still need to make an appt with plot

## 2014-09-29 NOTE — Telephone Encounter (Signed)
Ok Thx 

## 2014-10-29 ENCOUNTER — Encounter: Payer: Self-pay | Admitting: Internal Medicine

## 2014-11-10 ENCOUNTER — Other Ambulatory Visit (INDEPENDENT_AMBULATORY_CARE_PROVIDER_SITE_OTHER): Payer: PRIVATE HEALTH INSURANCE

## 2014-11-10 ENCOUNTER — Encounter: Payer: Self-pay | Admitting: Internal Medicine

## 2014-11-10 ENCOUNTER — Ambulatory Visit (INDEPENDENT_AMBULATORY_CARE_PROVIDER_SITE_OTHER): Payer: PRIVATE HEALTH INSURANCE | Admitting: Internal Medicine

## 2014-11-10 VITALS — BP 120/70 | HR 56 | Ht 70.0 in | Wt 184.0 lb

## 2014-11-10 DIAGNOSIS — Z0000 Encounter for general adult medical examination without abnormal findings: Secondary | ICD-10-CM

## 2014-11-10 DIAGNOSIS — D485 Neoplasm of uncertain behavior of skin: Secondary | ICD-10-CM

## 2014-11-10 LAB — LIPID PANEL
CHOLESTEROL: 132 mg/dL (ref 0–200)
HDL: 48.7 mg/dL (ref >39.00)
LDL Cholesterol: 59 mg/dL (ref 0–99)
NONHDL: 83.3
Total CHOL/HDL Ratio: 3
Triglycerides: 120 mg/dL (ref 0.0–149.0)
VLDL: 24 mg/dL (ref 0.0–40.0)

## 2014-11-10 LAB — HEPATIC FUNCTION PANEL
ALBUMIN: 4.8 g/dL (ref 3.5–5.2)
ALT: 25 U/L (ref 0–53)
AST: 28 U/L (ref 0–37)
Alkaline Phosphatase: 44 U/L (ref 39–117)
BILIRUBIN DIRECT: 0.1 mg/dL (ref 0.0–0.3)
TOTAL PROTEIN: 6.9 g/dL (ref 6.0–8.3)
Total Bilirubin: 0.6 mg/dL (ref 0.2–1.2)

## 2014-11-10 LAB — CBC WITH DIFFERENTIAL/PLATELET
BASOS PCT: 0.7 % (ref 0.0–3.0)
Basophils Absolute: 0 10*3/uL (ref 0.0–0.1)
EOS PCT: 4.3 % (ref 0.0–5.0)
Eosinophils Absolute: 0.2 10*3/uL (ref 0.0–0.7)
HCT: 43.3 % (ref 39.0–52.0)
Hemoglobin: 14.9 g/dL (ref 13.0–17.0)
LYMPHS ABS: 2.1 10*3/uL (ref 0.7–4.0)
LYMPHS PCT: 38.3 % (ref 12.0–46.0)
MCHC: 34.5 g/dL (ref 30.0–36.0)
MCV: 90.4 fl (ref 78.0–100.0)
MONOS PCT: 6.3 % (ref 3.0–12.0)
Monocytes Absolute: 0.3 10*3/uL (ref 0.1–1.0)
NEUTROS ABS: 2.8 10*3/uL (ref 1.4–7.7)
NEUTROS PCT: 50.4 % (ref 43.0–77.0)
Platelets: 158 10*3/uL (ref 150.0–400.0)
RBC: 4.79 Mil/uL (ref 4.22–5.81)
RDW: 12.6 % (ref 11.5–15.5)
WBC: 5.6 10*3/uL (ref 4.0–10.5)

## 2014-11-10 LAB — BASIC METABOLIC PANEL
BUN: 12 mg/dL (ref 6–23)
CALCIUM: 9.5 mg/dL (ref 8.4–10.5)
CO2: 28 meq/L (ref 19–32)
Chloride: 105 mEq/L (ref 96–112)
Creatinine, Ser: 1.03 mg/dL (ref 0.40–1.50)
GFR: 93.14 mL/min (ref >60.00)
Glucose, Bld: 101 mg/dL — ABNORMAL HIGH (ref 70–99)
POTASSIUM: 3.8 meq/L (ref 3.5–5.1)
SODIUM: 138 meq/L (ref 135–145)

## 2014-11-10 LAB — URINALYSIS
BILIRUBIN URINE: NEGATIVE
HGB URINE DIPSTICK: NEGATIVE
Ketones, ur: NEGATIVE
LEUKOCYTES UA: NEGATIVE
NITRITE: NEGATIVE
Specific Gravity, Urine: 1.01 (ref 1.000–1.030)
Total Protein, Urine: NEGATIVE
Urine Glucose: NEGATIVE
Urobilinogen, UA: 0.2 (ref 0.0–1.0)
pH: 5 (ref 5.0–8.0)

## 2014-11-10 LAB — TSH: TSH: 1.86 u[IU]/mL (ref 0.35–4.50)

## 2014-11-10 NOTE — Progress Notes (Signed)
Pre visit review using our clinic review tool, if applicable. No additional management support is needed unless otherwise documented below in the visit note. 

## 2014-11-10 NOTE — Progress Notes (Signed)
   Subjective:    Patient ID: [name] [name], male    DOB: May 04, 1991, 24 y.o.   MRN: [id number]  HPI  The patient is here for a wellness exam. The patient has been doing well overall without major physical or psychological issues going on lately.    Review of Systems  Constitutional: Negative for appetite change, fatigue and unexpected weight change.  HENT: Negative for congestion, nosebleeds, sneezing, sore throat and trouble swallowing.   Eyes: Negative for itching and visual disturbance.  Respiratory: Negative for cough.   Cardiovascular: Negative for chest pain, palpitations and leg swelling.  Gastrointestinal: Negative for nausea, diarrhea, blood in stool and abdominal distention.  Genitourinary: Negative for frequency and hematuria.  Musculoskeletal: Negative for back pain, joint swelling, gait problem and neck pain.  Skin: Negative for rash.  Neurological: Negative for dizziness, tremors, speech difficulty and weakness.  Psychiatric/Behavioral: Negative for suicidal ideas, sleep disturbance, dysphoric mood and agitation. The patient is not nervous/anxious.        Objective:   Physical Exam  Constitutional: He is oriented to person, place, and time. He appears well-developed. No distress.  NAD  HENT:  Mouth/Throat: Oropharynx is clear and moist.  Eyes: Conjunctivae are normal. Pupils are equal, round, and reactive to light.  Neck: Normal range of motion. No JVD present. No thyromegaly present.  Cardiovascular: Normal rate, regular rhythm, normal heart sounds and intact distal pulses.  Exam reveals no gallop and no friction rub.   No murmur heard. Pulmonary/Chest: Effort normal and breath sounds normal. No respiratory distress. He has no wheezes. He has no rales. He exhibits no tenderness.  Abdominal: Soft. Bowel sounds are normal. He exhibits no distension and no mass. There is no tenderness. There is no rebound and no guarding.  Musculoskeletal: Normal range of motion.  He exhibits no edema or tenderness.  Lymphadenopathy:    He has no cervical adenopathy.  Neurological: He is alert and oriented to person, place, and time. He has normal reflexes. No cranial nerve deficit. He exhibits normal muscle tone. He displays a negative Romberg sign. Coordination and gait normal.  Skin: Skin is warm and dry. No rash noted.  Psychiatric: He has a normal mood and affect. His behavior is normal. Judgment and thought content normal.   Moles on back  Lab Results  Component Value Date   WBC 5.6 11/10/2014   HGB 14.9 11/10/2014   HCT 43.3 11/10/2014   PLT 158.0 11/10/2014   GLUCOSE 101* 11/10/2014   CHOL 132 11/10/2014   TRIG 120.0 11/10/2014   HDL 48.70 11/10/2014   LDLCALC 59 11/10/2014   ALT 25 11/10/2014   AST 28 11/10/2014   NA 138 11/10/2014   K 3.8 11/10/2014   CL 105 11/10/2014   CREATININE 1.03 11/10/2014   BUN 12 11/10/2014   CO2 28 11/10/2014   TSH 1.86 11/10/2014   INR 1.07 01/21/2010         Assessment & Plan:

## 2014-11-10 NOTE — Assessment & Plan Note (Signed)
We discussed age appropriate health related issues, including available/recomended screening tests and vaccinations. We discussed a need for adhering to healthy diet and exercise. Labs/EKG were reviewed/ordered. All questions were answered.  Declined testes exam

## 2014-11-11 DIAGNOSIS — D485 Neoplasm of uncertain behavior of skin: Secondary | ICD-10-CM | POA: Insufficient documentation

## 2014-11-11 NOTE — Assessment & Plan Note (Signed)
3/16 multiple moles on back Skin bx was offered Wife will watch

## 2015-06-05 ENCOUNTER — Encounter: Payer: Self-pay | Admitting: Internal Medicine

## 2015-06-05 ENCOUNTER — Ambulatory Visit (INDEPENDENT_AMBULATORY_CARE_PROVIDER_SITE_OTHER): Payer: PRIVATE HEALTH INSURANCE | Admitting: Internal Medicine

## 2015-06-05 ENCOUNTER — Ambulatory Visit: Payer: PRIVATE HEALTH INSURANCE | Admitting: Internal Medicine

## 2015-06-05 ENCOUNTER — Other Ambulatory Visit (INDEPENDENT_AMBULATORY_CARE_PROVIDER_SITE_OTHER): Payer: PRIVATE HEALTH INSURANCE

## 2015-06-05 VITALS — BP 130/90 | HR 73 | Wt 178.0 lb

## 2015-06-05 DIAGNOSIS — R109 Unspecified abdominal pain: Secondary | ICD-10-CM

## 2015-06-05 DIAGNOSIS — R3911 Hesitancy of micturition: Secondary | ICD-10-CM

## 2015-06-05 LAB — BASIC METABOLIC PANEL
BUN: 21 mg/dL (ref 6–23)
CALCIUM: 9.6 mg/dL (ref 8.4–10.5)
CO2: 31 meq/L (ref 19–32)
Chloride: 103 mEq/L (ref 96–112)
Creatinine, Ser: 1.17 mg/dL (ref 0.40–1.50)
GFR: 80.05 mL/min (ref >60.00)
Glucose, Bld: 97 mg/dL (ref 70–99)
POTASSIUM: 4.4 meq/L (ref 3.5–5.1)
SODIUM: 141 meq/L (ref 135–145)

## 2015-06-05 LAB — CBC WITH DIFFERENTIAL/PLATELET
BASOS ABS: 0 10*3/uL (ref 0.0–0.1)
Basophils Relative: 0.5 % (ref 0.0–3.0)
Eosinophils Absolute: 0.2 10*3/uL (ref 0.0–0.7)
Eosinophils Relative: 3.5 % (ref 0.0–5.0)
HEMATOCRIT: 43.1 % (ref 39.0–52.0)
Hemoglobin: 14.7 g/dL (ref 13.0–17.0)
LYMPHS PCT: 25.2 % (ref 12.0–46.0)
Lymphs Abs: 1.5 10*3/uL (ref 0.7–4.0)
MCHC: 34.1 g/dL (ref 30.0–36.0)
MCV: 90.4 fl (ref 78.0–100.0)
MONOS PCT: 9.4 % (ref 3.0–12.0)
Monocytes Absolute: 0.6 10*3/uL (ref 0.1–1.0)
Neutro Abs: 3.8 10*3/uL (ref 1.4–7.7)
Neutrophils Relative %: 61.4 % (ref 43.0–77.0)
Platelets: 156 10*3/uL (ref 150.0–400.0)
RBC: 4.76 Mil/uL (ref 4.22–5.81)
RDW: 12.6 % (ref 11.5–15.5)
WBC: 6.1 10*3/uL (ref 4.0–10.5)

## 2015-06-05 LAB — URINALYSIS
Bilirubin Urine: NEGATIVE
Hgb urine dipstick: NEGATIVE
Ketones, ur: NEGATIVE
LEUKOCYTES UA: NEGATIVE
NITRITE: NEGATIVE
Total Protein, Urine: NEGATIVE
UROBILINOGEN UA: 0.2 (ref 0.0–1.0)
Urine Glucose: NEGATIVE
pH: 6 (ref 5.0–8.0)

## 2015-06-05 LAB — HEPATIC FUNCTION PANEL
ALT: 21 U/L (ref 0–53)
AST: 18 U/L (ref 0–37)
Albumin: 4.5 g/dL (ref 3.5–5.2)
Alkaline Phosphatase: 47 U/L (ref 39–117)
BILIRUBIN DIRECT: 0.1 mg/dL (ref 0.0–0.3)
BILIRUBIN TOTAL: 0.5 mg/dL (ref 0.2–1.2)
Total Protein: 7.1 g/dL (ref 6.0–8.3)

## 2015-06-05 NOTE — Progress Notes (Signed)
Subjective:  Patient ID: [name] [name], male    DOB: [birth year]  Age: 24 y.o. MRN: [id number]  CC: No chief complaint on file.   HPI [name] [name] presents for urgency, hesitancy, frequency since 24 yo. C/o R flank pain.  Outpatient Prescriptions Prior to Visit  Medication Sig Dispense Refill  . ibuprofen (ADVIL,MOTRIN) 200 MG tablet Take 200 mg by mouth every 6 (six) hours as needed for pain.     No facility-administered medications prior to visit.    ROS Review of Systems  Constitutional: Negative for fever, appetite change, fatigue and unexpected weight change.  HENT: Negative for congestion, nosebleeds, sneezing, sore throat and trouble swallowing.   Eyes: Negative for itching and visual disturbance.  Respiratory: Negative for cough.   Cardiovascular: Negative for chest pain, palpitations and leg swelling.  Gastrointestinal: Negative for nausea, diarrhea, blood in stool and abdominal distention.  Genitourinary: Positive for urgency, frequency, decreased urine volume and difficulty urinating. Negative for hematuria.  Musculoskeletal: Negative for back pain, joint swelling, arthralgias, gait problem and neck pain.  Skin: Negative for rash.  Neurological: Negative for dizziness, tremors, speech difficulty and weakness.  Psychiatric/Behavioral: Negative for sleep disturbance, dysphoric mood and agitation. The patient is not nervous/anxious.     Objective:  BP 130/90 mmHg  Pulse 73  Wt 178 lb (80.74 kg)  SpO2 96%  BP Readings from Last 3 Encounters:  06/05/15 130/90  11/10/14 120/70  01/02/13 126/76    Wt Readings from Last 3 Encounters:  06/05/15 178 lb (80.74 kg)  11/10/14 184 lb (83.462 kg)  03/16/12 172 lb (78.019 kg)    Physical Exam  Constitutional: He is oriented to person, place, and time. He appears well-developed. No distress.  NAD  HENT:  Mouth/Throat: Oropharynx is clear and moist.  Eyes: Conjunctivae are normal. Pupils are equal, round, and  reactive to light.  Neck: Normal range of motion. No JVD present. No thyromegaly present.  Cardiovascular: Normal rate, regular rhythm, normal heart sounds and intact distal pulses.  Exam reveals no gallop and no friction rub.   No murmur heard. Pulmonary/Chest: Effort normal and breath sounds normal. No respiratory distress. He has no wheezes. He has no rales. He exhibits no tenderness.  Abdominal: Soft. Bowel sounds are normal. He exhibits no distension and no mass. There is no tenderness. There is no rebound and no guarding.  Musculoskeletal: Normal range of motion. He exhibits no edema or tenderness.  Lymphadenopathy:    He has no cervical adenopathy.  Neurological: He is alert and oriented to person, place, and time. He has normal reflexes. No cranial nerve deficit. He exhibits normal muscle tone. He displays a negative Romberg sign. Coordination and gait normal.  Skin: Skin is warm and dry. No rash noted.  Psychiatric: He has a normal mood and affect. His behavior is normal. Judgment and thought content normal.     Lab Results  Component Value Date   WBC 6.1 06/05/2015   HGB 14.7 06/05/2015   HCT 43.1 06/05/2015   PLT 156.0 06/05/2015   GLUCOSE 97 06/05/2015   CHOL 132 11/10/2014   TRIG 120.0 11/10/2014   HDL 48.70 11/10/2014   LDLCALC 59 11/10/2014   ALT 21 06/05/2015   AST 18 06/05/2015   NA 141 06/05/2015   K 4.4 06/05/2015   CL 103 06/05/2015   CREATININE 1.17 06/05/2015   BUN 21 06/05/2015   CO2 31 06/05/2015   TSH 1.09 06/05/2015   INR 1.07 01/21/2010  Dg Foot Complete Left  01/02/2013   *RADIOLOGY REPORT*  Clinical Data: Pain and bruising in the left foot.  LEFT FOOT - COMPLETE 3+ VIEW  Comparison: None.  Findings: Three views of the left foot were obtained.  Alignment of the foot is normal.  No evidence for a fracture or dislocation. Mild joint space narrowing and degenerative changes at the first MTP joint.  IMPRESSION: No acute findings.   Original Report  Authenticated By: Markus Daft, M.D.    Assessment & Plan:   Diagnoses and all orders for this visit:  Right flank pain -     Basic metabolic panel; Future -     CBC with Differential/Platelet; Future -     TSH; Future -     Urinalysis; Future -     Hepatic function panel; Future -     US Abdomen Complete  Urinary hesitancy -     Basic metabolic panel; Future -     CBC with Differential/Platelet; Future -     TSH; Future -     Urinalysis; Future -     Hepatic function panel; Future -     US Abdomen Complete  I am having Mr. Nguyen maintain his ibuprofen.  No orders of the defined types were placed in this encounter.     Follow-up: Return in about 6 weeks (around 07/17/2015) for a follow-up visit.  Walker Kehr, MD

## 2015-06-05 NOTE — Progress Notes (Signed)
Pre visit review using our clinic review tool, if applicable. No additional management support is needed unless otherwise documented below in the visit note. 

## 2015-06-05 NOTE — Assessment & Plan Note (Signed)
10/16 ?etiology Labs [place] abd

## 2015-06-08 LAB — TSH: TSH: 1.09 u[IU]/mL (ref 0.35–4.50)

## 2015-06-16 ENCOUNTER — Encounter: Payer: Self-pay | Admitting: Internal Medicine

## 2015-06-17 ENCOUNTER — Ambulatory Visit
Admission: RE | Admit: 2015-06-17 | Discharge: 2015-06-17 | Disposition: A | Discharge status: Home / Self Care | Payer: PRIVATE HEALTH INSURANCE | Source: Ambulatory Visit | Attending: Internal Medicine | Admitting: Internal Medicine

## 2015-06-19 ENCOUNTER — Telehealth: Payer: Self-pay | Admitting: Internal Medicine

## 2015-06-19 DIAGNOSIS — R079 Chest pain, unspecified: Secondary | ICD-10-CM

## 2015-06-19 MED ORDER — PANTOPRAZOLE SODIUM 40 MG PO TBEC: 40.0000 mg | DELAYED_RELEASE_TABLET | Freq: Every day | ORAL | Status: DC

## 2015-06-19 NOTE — Telephone Encounter (Signed)
Start Pantoprazole 40 mg/d - emailed Call next wk if not better Thx

## 2015-06-19 NOTE — Telephone Encounter (Signed)
[facility] Patient Name: INDIE NICKERSON DOB: Jun 08, 1991 Initial Comment Caller states, husband was seen recently, advised he had pain in Rt flank. He had an ultra sound done earlier this week, The pain has gotten worse, it has moved from side into back, and now it is migrating into his front under his ribs down low. When he presses the flank, it feels very tender. Nurse Assessment Nurse: Martyn Ehrich, RN, Felicia Date/Time (Eastern Time): 06/19/2015 3:55:10 PM Confirm and document reason for call. If symptomatic, describe symptoms. ---PT at work [phone]. Was not able to reach the pt. PT has pain on R side pain wraps around from back to the front and very tender to touch. It started in lower back and he is feeling nauseated. He saw his MD a few weeks ago and had [place] Weds. today and result said kidneys and gallbladder looked fine. Today it is not any better and now a little more in the front. Told her cant give specific advice bc pt is not present. Could give some general information. Has the patient traveled out of the country within the last 30 days? ---No Does the patient have any new or worsening symptoms? ---Colbert Ewing a triage be completed? ---Yes Related visit to physician within the last 2 weeks? ---Yes Does the PT have any chronic conditions? (i.e. diabetes, asthma, etc.) ---No Guidelines Guideline Title Affirmed Question Affirmed Notes Final Disposition User Clinical Call Gaddy, RN, Felicia Comments in general if pain is continous of any level even fluctuation - he may need to be seen today - if pain is severe it is more concerning. If severe for 1 hour go to ER. If he is walking bent over or cant walk around that is more concerning. Get him to call us. (was not able to reach him at work just now) No vomiting with no diarrhea. If someone is confused call 911. Referenced stomach pain  triage guide for general info - told her can't be sure without speaking to him in person what his exact advice would be. Agreed to attempt to reach him at his no. one more time in about 10 min. attempted to reach pt again and there was no answer Call Id: [phone]

## 2015-06-19 NOTE — Telephone Encounter (Signed)
Patient called to get imaging results. Advised per dr plot's note. He has addtl questions regarding plan of care moving forward. He will be available this afternoon

## 2015-06-19 NOTE — Telephone Encounter (Signed)
[facility] Patient Name: [name] [name] DOB: 02-21-1991 Initial Comment Caller states her son has some pains in his side. Saw Dr Alain Marion last week. He is still having some pain. Nurse Assessment Nurse: Martyn Ehrich, RN, Felicia Date/Time (Eastern Time): 06/19/2015 6:12:11 PM Confirm and document reason for call. If symptomatic, describe symptoms. ---PT got on phone and said he has had pain in R side behind rib cage - now it is lower and is very sharp. It is also after he eats and it travels to his back - especially when sitting. They did [place] Weds Today pain is behind ribs when sitting and below ribs when he stands. Sharp pain around abdomen. Has the patient traveled out of the country within the last 30 days? ---No Does the patient have any new or worsening symptoms? ---Yes Will a triage be completed? ---YesRelated visit to physician within the last 2 weeks? ---Yes Does the PT have any chronic conditions? (i.e. diabetes, asthma, etc.) ---No Guidelines Guideline Title Affirmed Question Affirmed Notes Chest Pain [1] Intermittent chest pain or "angina" AND [2] increasing in severity or frequency (Exception: pains lasting a few seconds) Final Disposition User Go to ED Now Gaddy, RN, Felicia Comments pt is very confused about whether he found out the results were NL or not. PT said he want to try to protonix first and then see if he should go. He says he is not certain he knows his [place] results - had conversation with a gentleman but did not feel he was certain he got results. Told him so sorry that this nurse doesnt have that result and no ability to reach on call. He is having intermittent pain of 6-7 in chest more frequently see previous call ( [phone] ) where nursetried to reach him at work Referrals [place] REFUSED Disagree/Comply: Disagree Disagree/Comply Reason: Wait and seeCall Id:  [id number]

## 2015-06-19 NOTE — Telephone Encounter (Signed)
Left detailed mess informing pt of below.  

## 2015-06-19 NOTE — Telephone Encounter (Signed)
[facility] Patient Name: [name] [name] DOB: July 01, 1991 Initial Comment Caller states, husband was seen recently, advised he had pain in Rt flank. He had an ultra sound done earlier this week, The pain has gotten worse, it has moved from side into back, and now it is migrating into his front under his ribs down low. When he presses the flank, it feels very tender. Nurse Assessment Nurse: Martyn Ehrich, RN, Felicia Date/Time (Eastern Time): 06/19/2015 3:55:10 PM Confirm and document reason for call. If symptomatic, describe symptoms. ---PT at work [phone]. Was not able to reach the pt. PT has pain on R side pain wraps around from back to the front and very tender to touch. It started in lower back and he is feeling nauseated. He saw his MD a few weeks ago and had [place] Weds. today and result said kidneys and gallbladder looked fine. Today it is not any better and now a little more in the front. Told her cant give specific advice bc pt is not present. Could give some general information. Has the patient traveled out of the country within the last 30 days? ---NoDoes the patient have any new or worsening symptoms? ---Yes Will a triage be completed? ---Yes Related visit to physician within the last 2 weeks? ---Yes Does the PT have any chronic conditions? (i.e. diabetes, asthma, etc.) ---No Guidelines Guideline Title Affirmed Question Affirmed Notes Final Disposition User Clinical Call Gaddy, RN, Felicia Comments in general if pain is continous of any level even fluctuation - he may need to be seen today - if pain is severe it is more concerning. If severe for 1 hour go to ER. If he is walking bent over or cant walk around that is more concerning. Get him to call us. (was not able to reach him at work just now) No vomiting with no diarrhea. If someone is confused call 911. Referenced stomach pain  triage guide for general info - told her can't be sure without speaking to him in person what his exact advice would be. Agreed to attempt to reach him at his no. one more time in about 10 min. attempted to reach pt again and there was no answer Call Id: [id number]

## 2015-06-19 NOTE — Telephone Encounter (Signed)
Pt called back and stated that he is not any better, still have pain on the side, feeling nausea and it is getting worse since Sunday. Pt would like to know what else to do at this point, please call pt ASAP if possible (does not want to go through the weekend without knowing anything).   Call back # [phone]

## 2015-06-24 NOTE — Telephone Encounter (Signed)
Can you added to Plot 1:15 slot per Dr. Alain Marion?

## 2015-06-24 NOTE — Telephone Encounter (Signed)
Does this still need to happen? I cant get anyone on the phone to confirm

## 2015-06-24 NOTE — Telephone Encounter (Signed)
Get a CXR OV w/me - ?Thursday Use Protonix that was Rx'd Thx

## 2015-06-24 NOTE — Telephone Encounter (Signed)
Called pt spoke wife verified if husband receive msg from md adding him on tomorrow @ 1:15. Wife stated yes they are aware. Scheduled appt...[name] [name]

## 2015-06-24 NOTE — Telephone Encounter (Signed)
Pls come for a CXR and I could se him on Thur - 1:15 Thx

## 2015-06-24 NOTE — Telephone Encounter (Signed)
Are you referring to tomorrow? Or what day does this appointment need to be?

## 2015-06-24 NOTE — Telephone Encounter (Signed)
Yes, pt knows to come in and will be here tomorrow at 1:15

## 2015-06-25 ENCOUNTER — Ambulatory Visit (INDEPENDENT_AMBULATORY_CARE_PROVIDER_SITE_OTHER): Payer: PRIVATE HEALTH INSURANCE | Admitting: Internal Medicine

## 2015-06-25 ENCOUNTER — Ambulatory Visit (INDEPENDENT_AMBULATORY_CARE_PROVIDER_SITE_OTHER)
Admission: RE | Admit: 2015-06-25 | Discharge: 2015-06-25 | Disposition: A | Discharge status: Home / Self Care | Payer: PRIVATE HEALTH INSURANCE | Source: Ambulatory Visit | Attending: Internal Medicine | Admitting: Internal Medicine

## 2015-06-25 ENCOUNTER — Telehealth: Payer: Self-pay | Admitting: Family

## 2015-06-25 ENCOUNTER — Other Ambulatory Visit (INDEPENDENT_AMBULATORY_CARE_PROVIDER_SITE_OTHER): Payer: PRIVATE HEALTH INSURANCE | Admitting: *Deleted

## 2015-06-25 ENCOUNTER — Encounter: Payer: Self-pay | Admitting: Internal Medicine

## 2015-06-25 ENCOUNTER — Other Ambulatory Visit: Payer: Self-pay | Admitting: Internal Medicine

## 2015-06-25 VITALS — BP 140/80 | HR 59 | Temp 98.7°F | Wt 181.0 lb

## 2015-06-25 DIAGNOSIS — D485 Neoplasm of uncertain behavior of skin: Secondary | ICD-10-CM

## 2015-06-25 DIAGNOSIS — G4700 Insomnia, unspecified: Secondary | ICD-10-CM

## 2015-06-25 DIAGNOSIS — M2550 Pain in unspecified joint: Secondary | ICD-10-CM | POA: Insufficient documentation

## 2015-06-25 DIAGNOSIS — R1031 Right lower quadrant pain: Secondary | ICD-10-CM | POA: Diagnosis not present

## 2015-06-25 DIAGNOSIS — K625 Hemorrhage of anus and rectum: Secondary | ICD-10-CM

## 2015-06-25 DIAGNOSIS — K6289 Other specified diseases of anus and rectum: Secondary | ICD-10-CM

## 2015-06-25 DIAGNOSIS — R55 Syncope and collapse: Secondary | ICD-10-CM

## 2015-06-25 DIAGNOSIS — R109 Unspecified abdominal pain: Secondary | ICD-10-CM

## 2015-06-25 DIAGNOSIS — R6889 Other general symptoms and signs: Secondary | ICD-10-CM

## 2015-06-25 DIAGNOSIS — K921 Melena: Secondary | ICD-10-CM

## 2015-06-25 DIAGNOSIS — J4531 Mild persistent asthma with (acute) exacerbation: Secondary | ICD-10-CM

## 2015-06-25 DIAGNOSIS — L708 Other acne: Secondary | ICD-10-CM

## 2015-06-25 LAB — LIPASE: Lipase: 21 U/L (ref 7–60)

## 2015-06-25 LAB — HEPATIC FUNCTION PANEL
ALBUMIN: 4.7 g/dL (ref 3.6–5.1)
ALT: 26 U/L (ref 9–46)
AST: 20 U/L (ref 10–40)
Alkaline Phosphatase: 44 U/L (ref 40–115)
BILIRUBIN INDIRECT: 0.8 mg/dL (ref 0.2–1.2)
Bilirubin, Direct: 0.2 mg/dL (ref <=0.2)
TOTAL PROTEIN: 7.1 g/dL (ref 6.1–8.1)
Total Bilirubin: 1 mg/dL (ref 0.2–1.2)

## 2015-06-25 LAB — CBC WITH DIFFERENTIAL/PLATELET
BASOS ABS: 0 10*3/uL (ref 0.0–0.1)
Basophils Relative: 0 % (ref 0–1)
EOS PCT: 3 % (ref 0–5)
Eosinophils Absolute: 0.1 10*3/uL (ref 0.0–0.7)
HEMATOCRIT: 43.2 % (ref 39.0–52.0)
Hemoglobin: 15 g/dL (ref 13.0–17.0)
LYMPHS ABS: 2.1 10*3/uL (ref 0.7–4.0)
LYMPHS PCT: 44 % (ref 12–46)
MCH: 30.6 pg (ref 26.0–34.0)
MCHC: 34.7 g/dL (ref 30.0–36.0)
MCV: 88.2 fL (ref 78.0–100.0)
MONO ABS: 0.3 10*3/uL (ref 0.1–1.0)
MONOS PCT: 6 % (ref 3–12)
MPV: 11.5 fL (ref 8.6–12.4)
Neutro Abs: 2.3 10*3/uL (ref 1.7–7.7)
Neutrophils Relative %: 47 % (ref 43–77)
Platelets: 184 10*3/uL (ref 150–400)
RBC: 4.9 MIL/uL (ref 4.22–5.81)
RDW: 12.6 % (ref 11.5–15.5)
WBC: 4.8 10*3/uL (ref 4.0–10.5)

## 2015-06-25 LAB — BASIC METABOLIC PANEL
BUN: 17 mg/dL (ref 7–25)
CHLORIDE: 101 mmol/L (ref 98–110)
CO2: 27 mmol/L (ref 20–31)
CREATININE: 1.17 mg/dL (ref 0.60–1.35)
Calcium: 9.4 mg/dL (ref 8.6–10.3)
Glucose, Bld: 92 mg/dL (ref 65–99)
Potassium: 3.9 mmol/L (ref 3.5–5.3)
SODIUM: 140 mmol/L (ref 135–146)

## 2015-06-25 MED ORDER — LEVOFLOXACIN 500 MG PO TABS: 500.0000 mg | ORAL_TABLET | Freq: Every day | ORAL | Status: DC

## 2015-06-25 MED ORDER — TRAMADOL HCL 50 MG PO TABS: 50.0000 mg | ORAL_TABLET | Freq: Two times a day (BID) | ORAL | Status: DC | PRN

## 2015-06-25 MED ORDER — IOHEXOL 300 MG/ML  SOLN
100.0000 mL | Freq: Once | INTRAMUSCULAR | Status: AC | PRN
  Administered 2015-06-25: 100 mL via INTRAVENOUS

## 2015-06-25 MED ORDER — ONDANSETRON HCL 4 MG PO TABS: 4.0000 mg | ORAL_TABLET | Freq: Three times a day (TID) | ORAL | Status: DC | PRN

## 2015-06-25 NOTE — Assessment & Plan Note (Signed)
10/16 2-3 weeks  R/o appendicitis, colitis vs other  Zoran, Tramadol STAT labs, abd CT

## 2015-06-25 NOTE — Addendum Note (Signed)
Addended by: Eulis Foster on: 06/25/2015 02:38 PM   Modules accepted: Orders

## 2015-06-25 NOTE — Progress Notes (Signed)
Subjective:  Patient ID: [name] [name], male    DOB: 31-Jan-1991  Age: 24 y.o. MRN: [id number]  CC: No chief complaint on file.   HPI [name] [name] presents for arthralgias, muscles hurt, LBP, HA, R side pain, abd pain in lower 1/2. No fever. 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 pantoprazole (PROTONIX) 40 MG tablet Take 1 tablet (40 mg total) by mouth daily. 30 tablet 3   No facility-administered medications prior to visit.    ROS Review of Systems  Constitutional: Positive for chills and fatigue. Negative for fever, appetite change and unexpected weight change.  HENT: Negative for congestion, nosebleeds, sneezing, sore throat and trouble swallowing.   Eyes: Negative for itching and visual disturbance.  Respiratory: Negative for cough and shortness of breath.   Cardiovascular: Negative for chest pain, palpitations and leg swelling.  Gastrointestinal: Positive for abdominal pain, constipation and abdominal distention. Negative for nausea, diarrhea and blood in stool.  Genitourinary: Negative for frequency and hematuria.  Musculoskeletal: Positive for back pain and arthralgias. Negative for joint swelling, gait problem and neck pain.  Skin: Negative for rash.  Neurological: Negative for dizziness, tremors, speech difficulty and weakness.  Psychiatric/Behavioral: Negative for sleep disturbance, dysphoric mood and agitation. The patient is not nervous/anxious.     Objective:  BP 140/80 mmHg  Pulse 59  Temp(Src) 98.7 F (37.1 C) (Oral)  Wt 181 lb (82.101 kg)  SpO2 98%  BP Readings from Last 3 Encounters:  06/25/15 140/80  06/05/15 130/90  11/10/14 120/70    Wt Readings from Last 3 Encounters:  06/25/15 181 lb (82.101 kg)  06/05/15 178 lb (80.74 kg)  11/10/14 184 lb (83.462 kg)    Physical Exam  Constitutional: He is oriented to person, place, and time. He appears well-developed. No  distress.  NAD  HENT:  Mouth/Throat: Oropharynx is clear and moist.  Eyes: Conjunctivae are normal. Pupils are equal, round, and reactive to light.  Neck: Normal range of motion. No JVD present. No thyromegaly present.  Cardiovascular: Normal rate, regular rhythm, normal heart sounds and intact distal pulses.  Exam reveals no gallop and no friction rub.   No murmur heard. Pulmonary/Chest: Effort normal and breath sounds normal. No respiratory distress. He has no wheezes. He has no rales. He exhibits no tenderness.  Abdominal: Soft. Bowel sounds are normal. He exhibits no distension and no mass. There is tenderness. There is guarding. There is no rebound.  Musculoskeletal: Normal range of motion. He exhibits no edema or tenderness.  Lymphadenopathy:    He has no cervical adenopathy.  Neurological: He is alert and oriented to person, place, and time. He has normal reflexes. No cranial nerve deficit. He exhibits normal muscle tone. He displays a negative Romberg sign. Coordination and gait normal.  Skin: Skin is warm and dry. No rash noted.  Psychiatric: He has a normal mood and affect. His behavior is normal. Judgment and thought content normal.  Very tender in his RLQ (+/-) rebound  Lab Results  Component Value Date   WBC 6.1 06/05/2015   HGB 14.7 06/05/2015   HCT 43.1 06/05/2015   PLT 156.0 06/05/2015   GLUCOSE 97 06/05/2015   CHOL 132 11/10/2014   TRIG 120.0 11/10/2014   HDL 48.70 11/10/2014   LDLCALC 59 11/10/2014   ALT 21 06/05/2015   AST 18 06/05/2015   NA 141 06/05/2015   K 4.4 06/05/2015  CL 103 06/05/2015   CREATININE 1.17 06/05/2015   BUN 21 06/05/2015   CO2 31 06/05/2015   TSH 1.09 06/05/2015   INR 1.07 01/21/2010    US Abdomen Complete  06/17/2015  CLINICAL DATA:  Right flank pain, urinary urgency, frequency and difficulty for 4 years. EXAM: ULTRASOUND ABDOMEN COMPLETE COMPARISON:  CT scan 06/23/2008 FINDINGS: Gallbladder: No gallstones or wall thickening  visualized. No sonographic Murphy sign noted. Common bile duct: Diameter: 2.4 mm Liver: Normal echogenicity without focal lesion or biliary dilatation. IVC: Normal caliber Pancreas: Sonographically normal Spleen: Normal size and echogenicity without focal lesions Right Kidney: Length: 10.4 cm. Normal renal cortical thickness and echogenicity without focal lesions or hydronephrosis. Left Kidney: Length: 10.7 cm. Normal renal cortical thickness and echogenicity without focal lesions or hydronephrosis. Abdominal aorta: Normal caliber Other findings: None. IMPRESSION: Normal abdominal ultrasound examination. Electronically Signed   By: Marijo Sanes M.D.   On: 06/17/2015 09:08    Assessment & Plan:   There are no diagnoses linked to this encounter. I am having Mr. Hauss maintain his pantoprazole.  No orders of the defined types were placed in this encounter.     Follow-up: No Follow-up on file.  Walker Kehr, MD

## 2015-06-25 NOTE — Progress Notes (Signed)
Pre visit review using our clinic review tool, if applicable. No additional management support is needed unless otherwise documented below in the visit note. 

## 2015-06-25 NOTE — Assessment & Plan Note (Signed)
10/16 2-3 weeks  R/o appendicitis, colitis vs other - worsening  Zoran, Tramadol STAT labs, abd CT

## 2015-06-25 NOTE — Telephone Encounter (Signed)
Received phone call that CT of the abdomen was negative for appendicitis and informed caller to relay this to patient and that someone would follow up.

## 2015-06-25 NOTE — Patient Instructions (Signed)
Go to ER if worse 

## 2015-06-26 LAB — SEDIMENTATION RATE: SED RATE: 1 mm/h (ref 0–15)

## 2015-06-26 LAB — MONONUCLEOSIS SCREEN: Heterophile, Mono Screen: NEGATIVE

## 2015-06-26 NOTE — Telephone Encounter (Signed)
I called pt- See 06/25/15 CT results.

## 2015-07-09 ENCOUNTER — Encounter: Payer: Self-pay | Admitting: Internal Medicine

## 2015-07-15 ENCOUNTER — Telehealth: Payer: Self-pay | Admitting: Internal Medicine

## 2015-07-15 MED ORDER — LEVOFLOXACIN 500 MG PO TABS: 500.0000 mg | ORAL_TABLET | Freq: Every day | ORAL | Status: DC

## 2015-07-15 NOTE — Telephone Encounter (Signed)
OK. Will email Thx

## 2015-07-15 NOTE — Telephone Encounter (Signed)
Pt wife called in said that antibiotic was helping but he is out and wants to know if it needs to be refilled or what he needs to do?

## 2015-11-03 ENCOUNTER — Encounter: Payer: Self-pay | Admitting: Internal Medicine

## 2015-11-03 ENCOUNTER — Ambulatory Visit (INDEPENDENT_AMBULATORY_CARE_PROVIDER_SITE_OTHER): Payer: 59 | Admitting: Internal Medicine

## 2015-11-03 VITALS — BP 112/64 | HR 61 | Temp 98.6°F | Resp 20 | Wt 177.0 lb

## 2015-11-03 DIAGNOSIS — M79672 Pain in left foot: Secondary | ICD-10-CM | POA: Insufficient documentation

## 2015-11-03 MED ORDER — PREDNISONE 10 MG PO TABS: ORAL_TABLET | ORAL | Status: DC

## 2015-11-03 NOTE — Patient Instructions (Signed)
Please take all new medication as prescribed - the prednisone  Please see the internet for Achilles tendonitis type stretching excercises  Please consider wearing an ankle brace for 1-2 wks to see if this helps (during the day only)  You will be contacted regarding the referral for: Dr Tamala Julian - sports medicine (in this office)  Please continue all other medications as before, and refills have been done if requested.  Please have the pharmacy call with any other refills you may need.  Please keep your appointments with your specialists as you may have planned

## 2015-11-03 NOTE — Progress Notes (Signed)
   Subjective:    Patient ID: [name] [name], male    DOB: 1991/08/28, 25 y.o.   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  Nothing else seems to make better or worse.   Past Medical History  Diagnosis Date  . Asthma   . Asthma   . Rectal pain   . Rectal bleeding    Past Surgical History  Procedure Laterality Date  . Adenoidectomy    . Ankle surgery    . Flexible sigmoidoscopy  03/16/2012    Procedure: FLEXIBLE SIGMOIDOSCOPY;  Surgeon: Rogene Houston, MD;  Location: [facility];  Service: Endoscopy;  Laterality: N/A;  1215    reports that he has never smoked. He does not have any smokeless tobacco history on file. He reports that he does not drink alcohol or use illicit drugs. family history includes Cancer (age of onset: 73) in his maternal grandfather; Colon cancer in his paternal uncle. Allergies  Allergen Reactions  . Fluticasone-Salmeterol Anaphylaxis  . Cefaclor     unknown  . Diphenhydramine Hcl Hives  . Penicillins     unknown   Current Outpatient Prescriptions on File Prior to Visit  Medication Sig Dispense Refill  . pantoprazole (PROTONIX) 40 MG tablet Take 1 tablet (40 mg total) by mouth daily. 30 tablet 3  . traMADol (ULTRAM) 50 MG tablet Take 1-2 tablets (50-100 mg total) by mouth 2 (two) times daily as needed. 30 tablet 0   No current facility-administered medications on file prior to visit.   Review of Systems All otherwise neg per pt     Objective:   Physical Exam BP 112/64 mmHg  Pulse 61  Temp(Src) 98.6 F (37 C) (Oral)  Resp 20  Wt 177 lb (80.287 kg)  SpO2 97% VS noted,  Constitutional: Pt appears in no significant distress HENT: Head: NCAT.  Right Ear: External ear normal.  Left Ear: External ear normal.  Eyes: . Pupils are equal, round, and reactive to light.  Conjunctivae and EOM are normal Neck: Normal range of motion. Neck supple.  Cardiovascular: Normal rate and regular rhythm.   Pulmonary/Chest: Effort normal and breath sounds without rales or wheezing.  Left heel with mild to mod tenderness achilles in the midline approx 2 cm prox to the heel insertion site, foot o/w neurovasc intact  Neurological: Pt is alert. Not confused , motor grossly intact Skin: Skin is warm. No rash, no LE edema Psychiatric: Pt behavior is normal. No agitation.     Assessment & Plan:

## 2015-11-03 NOTE — Progress Notes (Signed)
Pre visit review using our clinic review tool, if applicable. No additional management support is needed unless otherwise documented below in the visit note. 

## 2015-11-08 NOTE — Assessment & Plan Note (Addendum)
C/w mod achilles tendonitis, for brief predpac asd, for nsaid prn, less walking if possible, light duty may be needed if not, for stretching excercises, consider seeing sport medicine in this office if not improved, consider otc ankle wrap as well for support

## 2016-05-23 IMAGING — CT CT ABD-PELV W/ CM
2 of 4 series · 17 of 46 positions shown, 19 images · IV contrast (Omnipaque 300)
Comparison: 06/23/2008

CLINICAL DATA: Right lower quadrant tenderness on exam. Low-grade
fever for 2-3 weeks. Evaluate for appendicitis

EXAM:
CT ABDOMEN AND PELVIS WITH CONTRAST
TECHNIQUE: Multidetector CT imaging of the abdomen and pelvis was performed
using the standard protocol following bolus administration of
intravenous contrast.
CONTRAST:  100mL OMNIPAQUE IOHEXOL 300 MG/ML  SOLN

[Series 2: abd/ pel 5mm · axial · 0.69mm/px · z∈[-524,-74]mm · 14 of 98 slices shown, 16 images]
[im 4/98  soft-tissue]
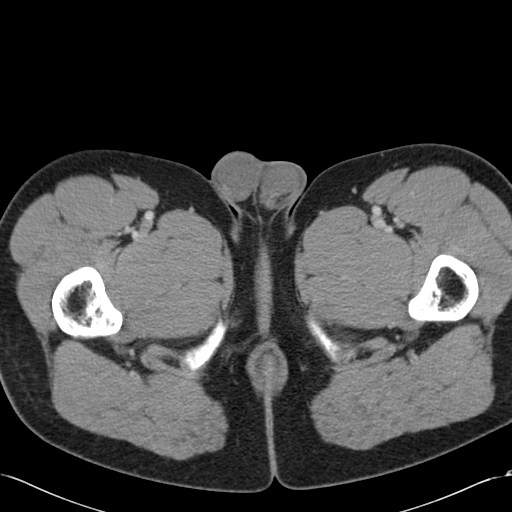
[im 4/98  bone]
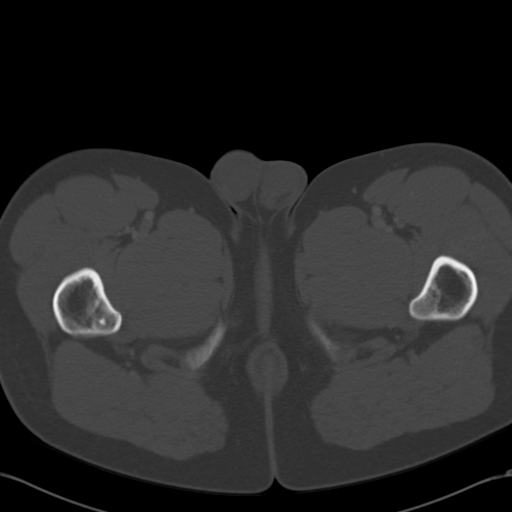
[im 12/98  soft-tissue]
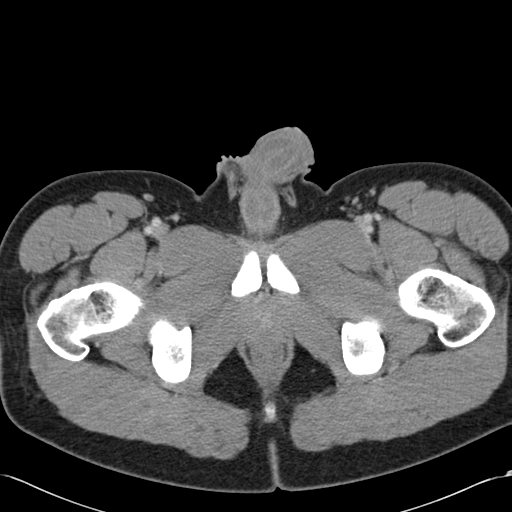
[im 19/98  soft-tissue]
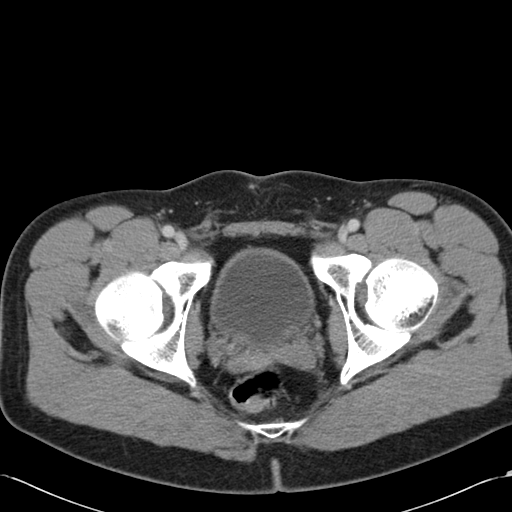
[im 27/98  soft-tissue]
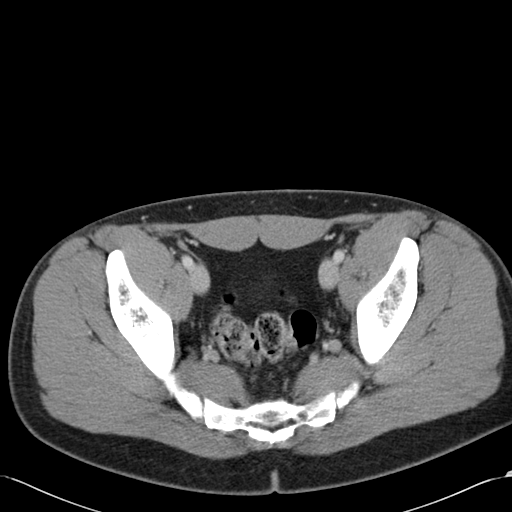
[im 34/98  soft-tissue]
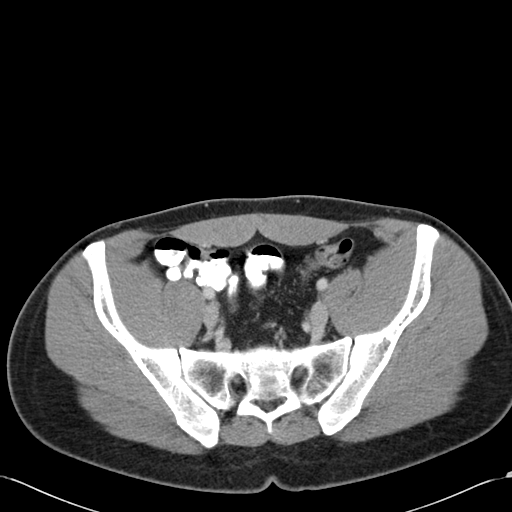
[im 38/98  soft-tissue]
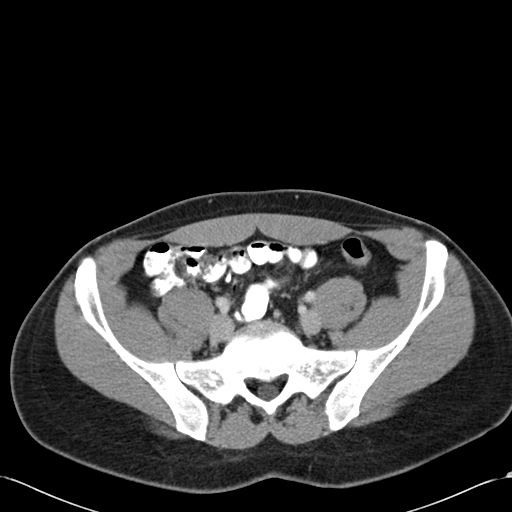
[im 45/98  soft-tissue]
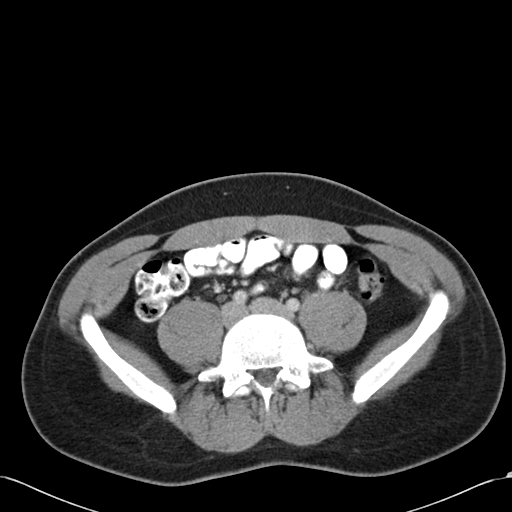
[im 53/98  soft-tissue]
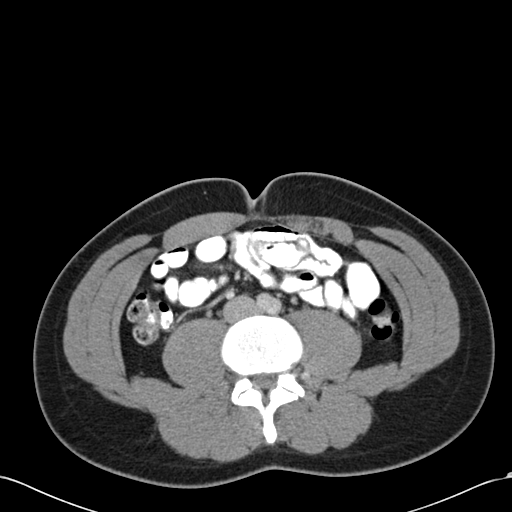
[im 60/98  soft-tissue]
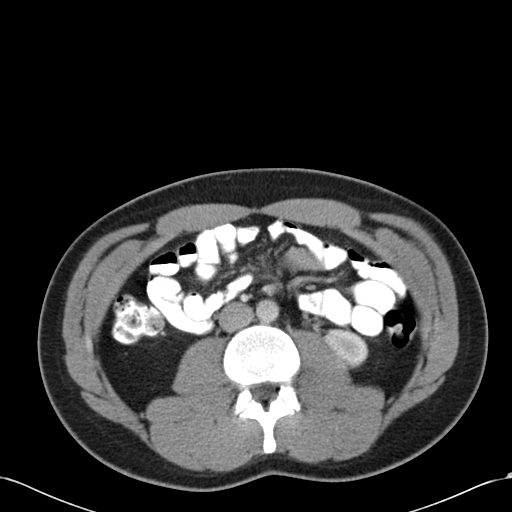
[im 60/98  bone]
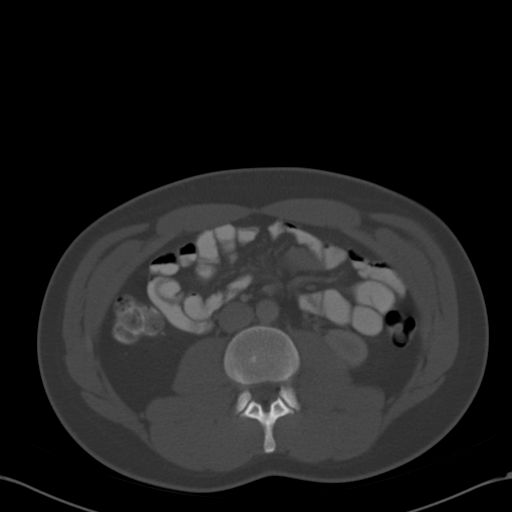
[im 64/98  soft-tissue]
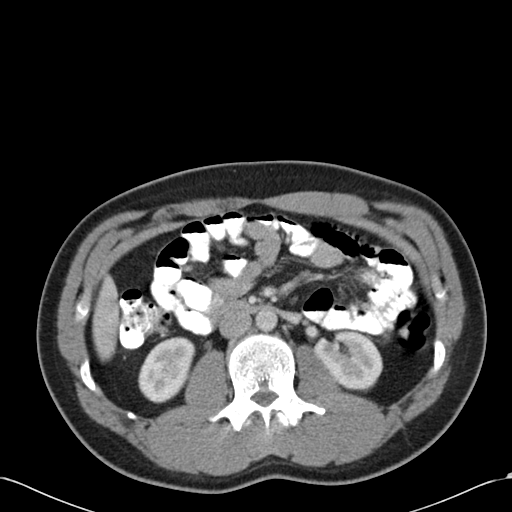
[im 71/98  soft-tissue]
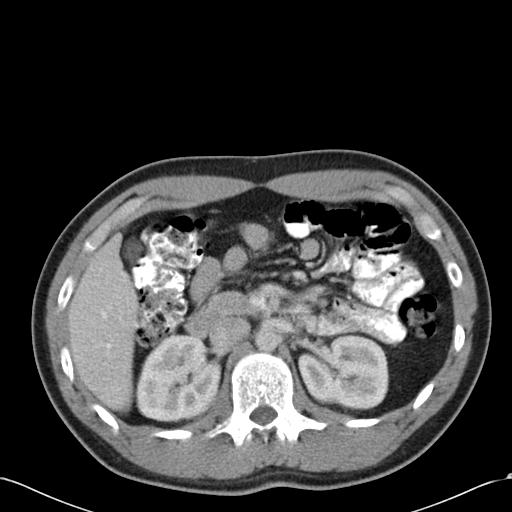
[im 79/98  soft-tissue]
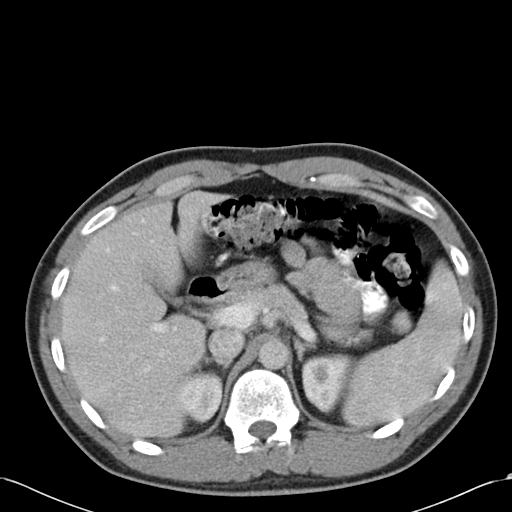
[im 86/98  soft-tissue]
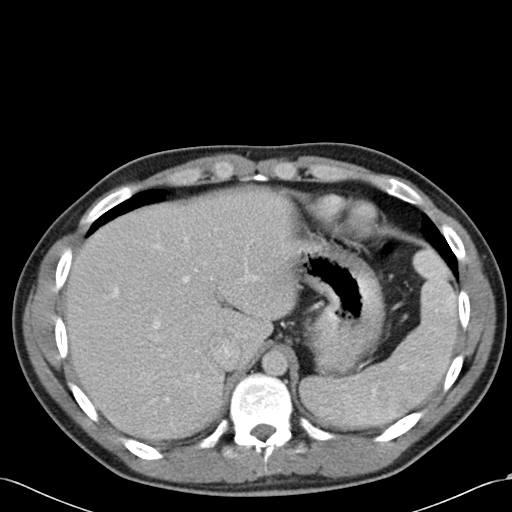
[im 94/98  soft-tissue]
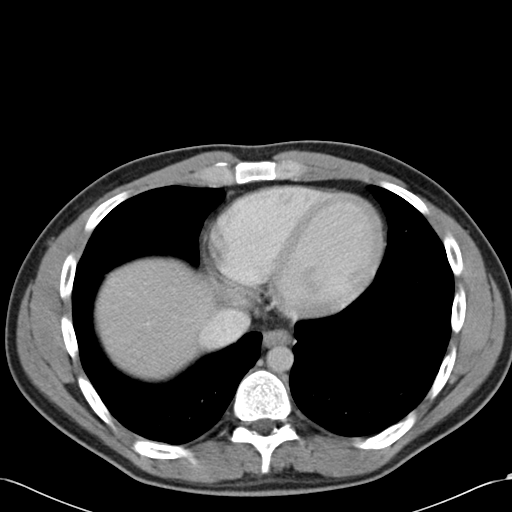

[Series 602: cor · coronal · 0.99mm/px · 3 of 107 slices shown]
[im 36/107  soft-tissue]
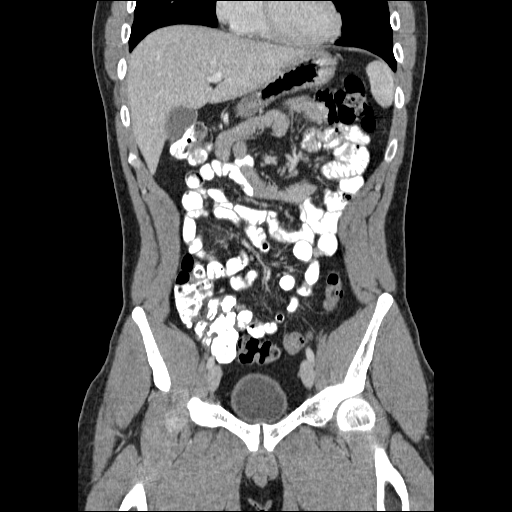
[im 48/107  soft-tissue]
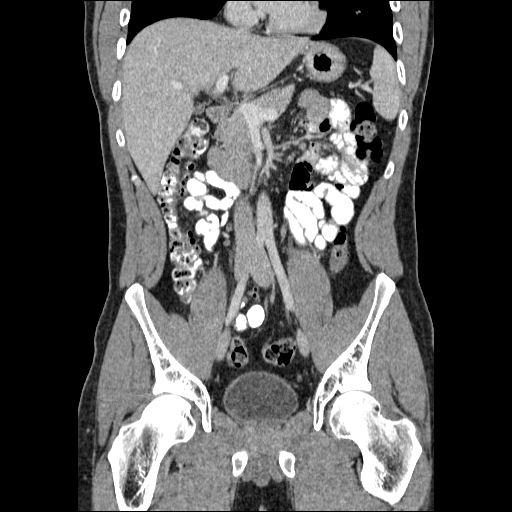
[im 59/107  soft-tissue]
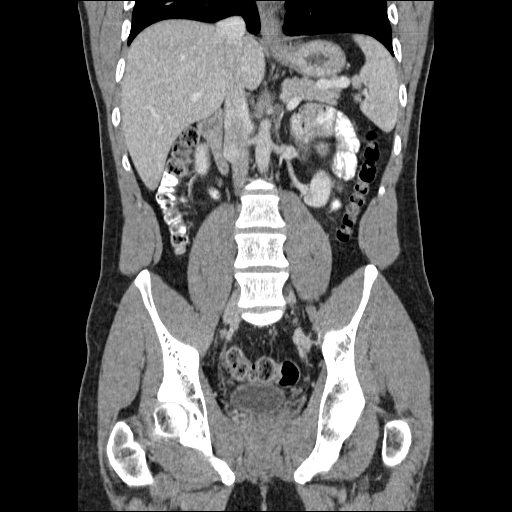

[17 of 46 positions shown; findings below may reference images not displayed]

FINDINGS: Lower chest and abdominal wall:  No contributory findings.

Hepatobiliary: No focal liver abnormality.No evidence of biliary
obstruction or stone.

Pancreas: Unremarkable.

Spleen: Stable size and appearance

Adrenals/Urinary Tract: Negative adrenals. No hydronephrosis or
stone. Unremarkable bladder.

Reproductive:No pathologic findings.

Stomach/Bowel:  No obstruction. No appendicitis.

Vascular/Lymphatic: No acute vascular abnormality. No mass or
adenopathy.

Peritoneal: No ascites or pneumoperitoneum.

Musculoskeletal: No acute abnormalities.
IMPRESSION: Negative. No appendicitis or other explanation for right lower
quadrant pain.

## 2016-07-14 ENCOUNTER — Encounter: Payer: Self-pay | Admitting: Nurse Practitioner

## 2016-07-14 ENCOUNTER — Ambulatory Visit (INDEPENDENT_AMBULATORY_CARE_PROVIDER_SITE_OTHER): Payer: 59 | Admitting: Nurse Practitioner

## 2016-07-14 VITALS — BP 122/88 | HR 92 | Temp 97.4°F | Ht 70.0 in | Wt 162.0 lb

## 2016-07-14 DIAGNOSIS — J069 Acute upper respiratory infection, unspecified: Secondary | ICD-10-CM

## 2016-07-14 DIAGNOSIS — J029 Acute pharyngitis, unspecified: Secondary | ICD-10-CM | POA: Diagnosis not present

## 2016-07-14 LAB — POCT RAPID STREP A (OFFICE): Rapid Strep A Screen: NEGATIVE

## 2016-07-14 MED ORDER — SACCHAROMYCES BOULARDII 250 MG PO CAPS: 250.0000 mg | ORAL_CAPSULE | Freq: Two times a day (BID) | ORAL | 0 refills | Status: DC

## 2016-07-14 MED ORDER — CETIRIZINE HCL 10 MG PO TABS: 10.0000 mg | ORAL_TABLET | Freq: Every day | ORAL | 0 refills | Status: DC

## 2016-07-14 MED ORDER — ALBUTEROL SULFATE HFA 108 (90 BASE) MCG/ACT IN AERS: 2.0000 | INHALATION_SPRAY | Freq: Four times a day (QID) | RESPIRATORY_TRACT | 0 refills | Status: DC | PRN

## 2016-07-14 MED ORDER — AZITHROMYCIN 250 MG PO TABS: 250.0000 mg | ORAL_TABLET | Freq: Every day | ORAL | 0 refills | Status: DC

## 2016-07-14 MED ORDER — IPRATROPIUM BROMIDE 0.03 % NA SOLN: 2.0000 | Freq: Two times a day (BID) | NASAL | 0 refills | Status: DC

## 2016-07-14 MED ORDER — GUAIFENESIN-DM 100-10 MG/5ML PO SYRP: 10.0000 mL | ORAL_SOLUTION | Freq: Four times a day (QID) | ORAL | 0 refills | Status: DC | PRN

## 2016-07-14 NOTE — Progress Notes (Signed)
Subjective:  Patient ID: [name] [name], male    DOB: 1990/09/01  Age: 25 y.o. MRN: [id number]  CC: Cough (pt stated fever, coughing, body aching, diarrhea for about 1 week)   Sinusitis  This is a new problem. The current episode started in the past 7 days. The problem has been gradually worsening since onset. There has been no fever. Associated symptoms include chills, congestion, coughing, headaches, a hoarse voice, shortness of breath, sinus pressure, a sore throat and swollen glands. Pertinent negatives include no ear pain. Past treatments include acetaminophen and oral decongestants. The treatment provided no relief.   Had 2episodes of loose stool today, no ABD pain, no melena, no hematochezia. No GU symptoms.  Outpatient Medications Prior to Visit  Medication Sig Dispense Refill  . pantoprazole (PROTONIX) 40 MG tablet Take 1 tablet (40 mg total) by mouth daily. 30 tablet 3  . predniSONE (DELTASONE) 10 MG tablet 3 tabs by mouth per day for 3 days,2tabs per day for 3 days,1tab per day for 3 days 18 tablet 0  . traMADol (ULTRAM) 50 MG tablet Take 1-2 tablets (50-100 mg total) by mouth 2 (two) times daily as needed. 30 tablet 0   No facility-administered medications prior to visit.     ROS See HPI  Objective:  BP 122/88 (BP Location: Left Arm, Patient Position: Sitting, Cuff Size: Normal)   Pulse 92   Temp 97.4 F (36.3 C)   Ht 5\' 10"  (1.778 m)   Wt 162 lb (73.5 kg)   SpO2 98%   BMI 23.24 kg/m   BP Readings from Last 3 Encounters:  07/14/16 122/88  11/03/15 112/64  06/25/15 140/80    Wt Readings from Last 3 Encounters:  07/14/16 162 lb (73.5 kg)  11/03/15 177 lb (80.3 kg)  06/25/15 181 lb (82.1 kg)    Physical Exam  Constitutional: He is oriented to person, place, and time. No distress.  HENT:  Right Ear: Tympanic membrane, external ear and ear canal normal.  Left Ear: Tympanic membrane and ear canal normal.  Nose: Mucosal edema and rhinorrhea present. Right  sinus exhibits maxillary sinus tenderness and frontal sinus tenderness. Left sinus exhibits maxillary sinus tenderness and frontal sinus tenderness.  Mouth/Throat: Uvula is midline. Posterior oropharyngeal erythema present. No oropharyngeal exudate.  Eyes: No scleral icterus.  Neck: Normal range of motion. Neck supple.  Cardiovascular: Normal rate and regular rhythm.   Pulmonary/Chest: Effort normal and breath sounds normal.  Abdominal: Soft. Bowel sounds are normal. He exhibits no distension. There is no tenderness. There is no rebound and no guarding.  Lymphadenopathy:    He has cervical adenopathy.  Neurological: He is alert and oriented to person, place, and time.  Skin: Skin is warm and dry.  Vitals reviewed.   Lab Results  Component Value Date   WBC 4.8 06/25/2015   HGB 15.0 06/25/2015   HCT 43.2 06/25/2015   PLT 184 06/25/2015   GLUCOSE 92 06/25/2015   CHOL 132 11/10/2014   TRIG 120.0 11/10/2014   HDL 48.70 11/10/2014   LDLCALC 59 11/10/2014   ALT 26 06/25/2015   AST 20 06/25/2015   NA 140 06/25/2015   K 3.9 06/25/2015   CL 101 06/25/2015   CREATININE 1.17 06/25/2015   BUN 17 06/25/2015   CO2 27 06/25/2015   TSH 1.09 06/05/2015   INR 1.07 01/21/2010    Ct Abdomen Pelvis W Contrast  Result Date: 06/25/2015 CLINICAL DATA:  Right lower quadrant tenderness on exam. Low-grade fever  for 2-3 weeks. Evaluate for appendicitis EXAM: CT ABDOMEN AND PELVIS WITH CONTRAST TECHNIQUE: Multidetector CT imaging of the abdomen and pelvis was performed using the standard protocol following bolus administration of intravenous contrast. CONTRAST:  157mL OMNIPAQUE IOHEXOL 300 MG/ML  SOLN COMPARISON:  06/23/2008 FINDINGS: Lower chest and abdominal wall:  No contributory findings. Hepatobiliary: No focal liver abnormality.No evidence of biliary obstruction or stone. Pancreas: Unremarkable. Spleen: Stable size and appearance Adrenals/Urinary Tract: Negative adrenals. No hydronephrosis or stone.  Unremarkable bladder. Reproductive:No pathologic findings. Stomach/Bowel:  No obstruction. No appendicitis. Vascular/Lymphatic: No acute vascular abnormality. No mass or adenopathy. Peritoneal: No ascites or pneumoperitoneum. Musculoskeletal: No acute abnormalities. IMPRESSION: Negative. No appendicitis or other explanation for right lower quadrant pain. Electronically Signed   By: Monte Fantasia M.D.   On: 06/25/2015 16:18    Assessment & Plan:   [name] [name] was seen today for cough.  Diagnoses and all orders for this visit:  Acute URI -     Discontinue: cetirizine (ZYRTEC) 10 MG tablet; Take 1 tablet (10 mg total) by mouth daily. -     azithromycin (ZITHROMAX Z-PAK) 250 MG tablet; Take 1 tablet (250 mg total) by mouth daily. Take 2tabs on first day, then 1tab once a day till complete -     albuterol (PROVENTIL HFA;VENTOLIN HFA) 108 (90 Base) MCG/ACT inhaler; Inhale 2 puffs into the lungs every 6 (six) hours as needed for wheezing or shortness of breath. -     ipratropium (ATROVENT) 0.03 % nasal spray; Place 2 sprays into both nostrils 2 (two) times daily. Do not use for more than 5days. -     saccharomyces boulardii (FLORASTOR) 250 MG capsule; Take 1 capsule (250 mg total) by mouth 2 (two) times daily. -     guaiFENesin-dextromethorphan (ROBITUSSIN DM) 100-10 MG/5ML syrup; Take 10 mLs by mouth every 6 (six) hours as needed for cough. -     cetirizine (ZYRTEC) 10 MG tablet; Take 1 tablet (10 mg total) by mouth daily.  Sore throat -     POCT rapid strep A   I have discontinued [name] [name]'s pantoprazole, traMADol, and predniSONE. I am also having him start on azithromycin, albuterol, ipratropium, saccharomyces boulardii, and guaiFENesin-dextromethorphan. Additionally, I am having him maintain his cetirizine.  Meds ordered this encounter  Medications  . DISCONTD: cetirizine (ZYRTEC) 10 MG tablet    Sig: Take 1 tablet (10 mg total) by mouth daily.    Dispense:  30 tablet    Refill:  0     Order Specific Question:   Supervising Provider    Answer:   Cassandria Anger [[id number]]  . azithromycin (ZITHROMAX Z-PAK) 250 MG tablet    Sig: Take 1 tablet (250 mg total) by mouth daily. Take 2tabs on first day, then 1tab once a day till complete    Dispense:  6 tablet    Refill:  0    Order Specific Question:   Supervising Provider    Answer:   Cassandria Anger [[id number]]  . albuterol (PROVENTIL HFA;VENTOLIN HFA) 108 (90 Base) MCG/ACT inhaler    Sig: Inhale 2 puffs into the lungs every 6 (six) hours as needed for wheezing or shortness of breath.    Dispense:  1 Inhaler    Refill:  0    Order Specific Question:   Supervising Provider    Answer:   Cassandria Anger [[id number]]  . ipratropium (ATROVENT) 0.03 % nasal spray    Sig: Place 2 sprays into both nostrils 2 (two)  times daily. Do not use for more than 5days.    Dispense:  30 mL    Refill:  0    Order Specific Question:   Supervising Provider    Answer:   Cassandria Anger [[id number]]  . saccharomyces boulardii (FLORASTOR) 250 MG capsule    Sig: Take 1 capsule (250 mg total) by mouth 2 (two) times daily.    Dispense:  20 capsule    Refill:  0    Order Specific Question:   Supervising Provider    Answer:   Cassandria Anger [[id number]]  . guaiFENesin-dextromethorphan (ROBITUSSIN DM) 100-10 MG/5ML syrup    Sig: Take 10 mLs by mouth every 6 (six) hours as needed for cough.    Dispense:  118 mL    Refill:  0    Order Specific Question:   Supervising Provider    Answer:   Cassandria Anger [[id number]]  . cetirizine (ZYRTEC) 10 MG tablet    Sig: Take 1 tablet (10 mg total) by mouth daily.    Dispense:  30 tablet    Refill:  0    Order Specific Question:   Supervising Provider    Answer:   Cassandria Anger [[id number]]    Follow-up: Return if symptoms worsen or fail to improve.  Wilfred Lacy, NP

## 2016-07-14 NOTE — Progress Notes (Signed)
Pre visit review using our clinic review tool, if applicable. No additional management support is needed unless otherwise documented below in the visit note. 

## 2016-07-14 NOTE — Patient Instructions (Signed)
URI Instructions: Use over-the-counter  "cold" medicines  such as "Tylenol cold" , "Advil cold",  "Mucinex" or" Mucinex D"  for cough and congestion.  Avoid decongestants if you have high blood pressure. Use" Delsym" or" Robitussin" cough syrup varietis for cough.  You can use plain "Tylenol" or "Advi"l for fever, chills and achyness.   "Common cold" symptoms are usually triggered by a virus.  The antibiotics are usually not necessary. On average, a" viral cold" illness would take 4-7 days to resolve. Please, make an appointment if you are not better or if you're worse.

## 2016-07-14 NOTE — Progress Notes (Signed)
Reviewed with patient in office. See office note

## 2016-07-29 ENCOUNTER — Telehealth: Payer: Self-pay | Admitting: Internal Medicine

## 2016-07-29 NOTE — Telephone Encounter (Signed)
Patient is calling about some paperwork he states he dropped off yesterday 11/30. He just wanted to make sure someone called back once it was done. Thank you.

## 2016-07-29 NOTE — Telephone Encounter (Signed)
Pt informed form given to PCP for signature. Will call him when completed.

## 2016-08-02 NOTE — Telephone Encounter (Signed)
Form is in PCP's red folder for completion.

## 2016-08-02 NOTE — Telephone Encounter (Signed)
Pt called back in about this form, he needs this ASAP!!

## 2016-08-03 NOTE — Telephone Encounter (Signed)
Done

## 2016-08-08 NOTE — Telephone Encounter (Signed)
I called pt to see if anyone ever notified him papers were completed by PCP. No answer/mailbox is full unable to leave vm. Will try again later.

## 2016-10-19 ENCOUNTER — Encounter: Payer: Self-pay | Admitting: Podiatry

## 2016-10-19 ENCOUNTER — Ambulatory Visit (INDEPENDENT_AMBULATORY_CARE_PROVIDER_SITE_OTHER): Payer: 59

## 2016-10-19 ENCOUNTER — Ambulatory Visit: Payer: 59

## 2016-10-19 ENCOUNTER — Ambulatory Visit (INDEPENDENT_AMBULATORY_CARE_PROVIDER_SITE_OTHER): Payer: 59 | Admitting: Podiatry

## 2016-10-19 VITALS — BP 118/65 | HR 67 | Resp 16 | Ht 71.0 in | Wt 160.0 lb

## 2016-10-19 DIAGNOSIS — M7662 Achilles tendinitis, left leg: Secondary | ICD-10-CM

## 2016-10-19 DIAGNOSIS — M205X1 Other deformities of toe(s) (acquired), right foot: Secondary | ICD-10-CM

## 2016-10-19 DIAGNOSIS — M79671 Pain in right foot: Secondary | ICD-10-CM

## 2016-10-19 DIAGNOSIS — S92316A Nondisplaced fracture of first metatarsal bone, unspecified foot, initial encounter for closed fracture: Secondary | ICD-10-CM

## 2016-10-19 DIAGNOSIS — M79672 Pain in left foot: Secondary | ICD-10-CM

## 2016-10-19 NOTE — Progress Notes (Signed)
   Subjective:    Patient ID: [name] [name], male    DOB: 08/18/1991, 26 y.o.   MRN: [id number]  HPI Chief Complaint  Patient presents with  . Foot Pain    Right foot; plantar forefoot-below great toe; pt stated, "Was told by Dr. Gershon Mussel had fracture in ball of foot; got a cortisone injection 2 weeks ago"; x2+ yrs  . Foot Pain    Left foot; back of heel; pt stated, "Got a cortisone injection a month ago"; x1.5 months      Review of Systems  Musculoskeletal: Positive for gait problem.  All other systems reviewed and are negative.      Objective:   Physical Exam        Assessment & Plan:

## 2016-10-19 NOTE — Patient Instructions (Addendum)
Achilles Tendinitis  with Rehab Achilles tendinitis is a disorder of the Achilles tendon. The Achilles tendon connects the large calf muscles (Gastrocnemius and Soleus) to the heel bone (calcaneus). This tendon is sometimes called the heel cord. It is important for pushing-off and standing on your toes and is important for walking, running, or jumping. Tendinitis is often caused by overuse and repetitive microtrauma. SYMPTOMS  Pain, tenderness, swelling, warmth, and redness may occur over the Achilles tendon even at rest.  Pain with pushing off, or flexing or extending the ankle.  Pain that is worsened after or during activity. CAUSES   Overuse sometimes seen with rapid increase in exercise programs or in sports requiring running and jumping.  Poor physical conditioning (strength and flexibility or endurance).  Running sports, especially training running down hills.  Inadequate warm-up before practice or play or failure to stretch before participation.  Injury to the tendon. PREVENTION   Warm up and stretch before practice or competition.  Allow time for adequate rest and recovery between practices and competition.  Keep up conditioning.  Keep up ankle and leg flexibility.  Improve or keep muscle strength and endurance.  Improve cardiovascular fitness.  Use proper technique.  Use proper equipment (shoes, skates).  To help prevent recurrence, taping, protective strapping, or an adhesive bandage may be recommended for several weeks after healing is complete. PROGNOSIS   Recovery may take weeks to several months to heal.  Longer recovery is expected if symptoms have been prolonged.  Recovery is usually quicker if the inflammation is due to a direct blow as compared with overuse or sudden strain. RELATED COMPLICATIONS   Healing time will be prolonged if the condition is not correctly treated. The injury must be given plenty of time to heal.  Symptoms can reoccur if  activity is resumed too soon.  Untreated, tendinitis may increase the risk of tendon rupture requiring additional time for recovery and possibly surgery. TREATMENT   The first treatment consists of rest anti-inflammatory medication, and ice to relieve the pain.  Stretching and strengthening exercises after resolution of pain will likely help reduce the risk of recurrence. Referral to a physical therapist or athletic trainer for further evaluation and treatment may be helpful.  A walking boot or cast may be recommended to rest the Achilles tendon. This can help break the cycle of inflammation and microtrauma.  Arch supports (orthotics) may be prescribed or recommended by your caregiver as an adjunct to therapy and rest.  Surgery to remove the inflamed tendon lining or degenerated tendon tissue is rarely necessary and has shown less than predictable results. MEDICATION   Nonsteroidal anti-inflammatory medications, such as aspirin and ibuprofen, may be used for pain and inflammation relief. Do not take within 7 days before surgery. Take these as directed by your caregiver. Contact your caregiver immediately if any bleeding, stomach upset, or signs of allergic reaction occur. Other minor pain relievers, such as acetaminophen, may also be used.  Pain relievers may be prescribed as necessary by your caregiver. Do not take prescription pain medication for longer than 4 to 7 days. Use only as directed and only as much as you need.  Cortisone injections are rarely indicated. Cortisone injections may weaken tendons and predispose to rupture. It is better to give the condition more time to heal than to use them. HEAT AND COLD  Cold is used to relieve pain and reduce inflammation for acute and chronic Achilles tendinitis. Cold should be applied for 10 to 15 minutes   every 2 to 3 hours for inflammation and pain and immediately after any activity that aggravates your symptoms. Use ice packs or an ice  massage.  Heat may be used before performing stretching and strengthening activities prescribed by your caregiver. Use a heat pack or a warm soak. SEEK MEDICAL CARE IF:  Symptoms get worse or do not improve in 2 weeks despite treatment.  New, unexplained symptoms develop. Drugs used in treatment may produce side effects.  EXERCISES:  RANGE OF MOTION (ROM) AND STRETCHING EXERCISES - Achilles Tendinitis  These exercises may help you when beginning to rehabilitate your injury. Your symptoms may resolve with or without further involvement from your physician, physical therapist or athletic trainer. While completing these exercises, remember:   Restoring tissue flexibility helps normal motion to return to the joints. This allows healthier, less painful movement and activity.  An effective stretch should be held for at least 30 seconds.  A stretch should never be painful. You should only feel a gentle lengthening or release in the stretched tissue.  STRETCH  Gastroc, Standing   Place hands on wall.  Extend right / left leg, keeping the front knee somewhat bent.  Slightly point your toes inward on your back foot.  Keeping your right / left heel on the floor and your knee straight, shift your weight toward the wall, not allowing your back to arch.  You should feel a gentle stretch in the right / left calf. Hold this position for 10 seconds. Repeat 3 times. Complete this stretch 2 times per day.  STRETCH  Soleus, Standing   Place hands on wall.  Extend right / left leg, keeping the other knee somewhat bent.  Slightly point your toes inward on your back foot.  Keep your right / left heel on the floor, bend your back knee, and slightly shift your weight over the back leg so that you feel a gentle stretch deep in your back calf.  Hold this position for 10 seconds. Repeat 3 times. Complete this stretch 2 times per day.  STRETCH  Gastrocsoleus, Standing  Note: This exercise can place  a lot of stress on your foot and ankle. Please complete this exercise only if specifically instructed by your caregiver.   Place the ball of your right / left foot on a step, keeping your other foot firmly on the same step.  Hold on to the wall or a rail for balance.  Slowly lift your other foot, allowing your body weight to press your heel down over the edge of the step.  You should feel a stretch in your right / left calf.  Hold this position for 10 seconds.  Repeat this exercise with a slight bend in your knee. Repeat 3 times. Complete this stretch 2 times per day.   STRENGTHENING EXERCISES - Achilles Tendinitis These exercises may help you when beginning to rehabilitate your injury. They may resolve your symptoms with or without further involvement from your physician, physical therapist or athletic trainer. While completing these exercises, remember:   Muscles can gain both the endurance and the strength needed for everyday activities through controlled exercises.  Complete these exercises as instructed by your physician, physical therapist or athletic trainer. Progress the resistance and repetitions only as guided.  You may experience muscle soreness or fatigue, but the pain or discomfort you are trying to eliminate should never worsen during these exercises. If this pain does worsen, stop and make certain you are following the directions exactly. If   the pain is still present after adjustments, discontinue the exercise until you can discuss the trouble with your clinician.  STRENGTH - Plantar-flexors   Sit with your right / left leg extended. Holding onto both ends of a rubber exercise band/tubing, loop it around the ball of your foot. Keep a slight tension in the band.  Slowly push your toes away from you, pointing them downward.  Hold this position for 10 seconds. Return slowly, controlling the tension in the band/tubing. Repeat 3 times. Complete this exercise 2 times per day.     STRENGTH - Plantar-flexors   Stand with your feet shoulder width apart. Steady yourself with a wall or table using as little support as needed.  Keeping your weight evenly spread over the width of your feet, rise up on your toes.*  Hold this position for 10 seconds. Repeat 3 times. Complete this exercise 2 times per day.  *If this is too easy, shift your weight toward your right / left leg until you feel challenged. Ultimately, you may be asked to do this exercise with your right / left foot only.  STRENGTH  Plantar-flexors, Eccentric  Note: This exercise can place a lot of stress on your foot and ankle. Please complete this exercise only if specifically instructed by your caregiver.   Place the balls of your feet on a step. With your hands, use only enough support from a wall or rail to keep your balance.  Keep your knees straight and rise up on your toes.  Slowly shift your weight entirely to your right / left toes and pick up your opposite foot. Gently and with controlled movement, lower your weight through your right / left foot so that your heel drops below the level of the step. You will feel a slight stretch in the back of your calf at the end position.  Use the healthy leg to help rise up onto the balls of both feet, then lower weight only on the right / left leg again. Build up to 15 repetitions. Then progress to 3 consecutive sets of 15 repetitions.*  After completing the above exercise, complete the same exercise with a slight knee bend (about 30 degrees). Again, build up to 15 repetitions. Then progress to 3 consecutive sets of 15 repetitions.* Perform this exercise 2 times per day.  *When you easily complete 3 sets of 15, your physician, physical therapist or athletic trainer may advise you to add resistance by wearing a backpack filled with additional weight.  STRENGTH - Plantar Flexors, Seated   Sit on a chair that allows your feet to rest flat on the ground. If  necessary, sit at the edge of the chair.  Keeping your toes firmly on the ground, lift your right / left heel as far as you can without increasing any discomfort in your ankle. Repeat 3 times. Complete this exercise 2 times a day.  Hallux Rigidus Introduction Hallux rigidus is a type of joint pain or joint disease (arthritis) that affects your big toe (hallux). This condition involves the joint that connects the base of your big toe to the main part of your foot (metatarsophalangeal joint). This condition can cause your big toe to become stiff, painful, and difficult to move. Symptoms may get worse with movement or in cold or damp weather. The condition also gets worse over time. What are the causes? This condition may be caused by having a foot that does not function the way that it should or has an  abnormal shape (structural deformity). These foot problems can run in families (be hereditary). This condition can also be caused by:  Injury.  Overuse.  Certain inflammatory diseases, including gout and rheumatoid arthritis. What increases the risk? This condition is more likely to develop in people who:  Have a foot bone (metatarsal) that is longer or higher than normal.  Have a family history of hallux rigidus.  Have previously injured their big toe.  Have feet that do not have a curve (arch) on the inner side of the foot. This may be called flat feet or fallen arches.  Turn their ankles in when they walk (pronation).  Have rheumatoid arthritis or gout.  Have to stoop down often at work. What are the signs or symptoms? Symptoms of this condition include:  Big toe pain.  Stiffness and difficulty moving the big toe.  Swelling of the toe and surrounding area.  Bone spurs. These are bony growths that can form on the joint of the big toe.  A limp. How is this diagnosed? This condition is diagnosed based on a medical history and physical exam. This may include X-rays. How is  this treated? Treatment for this condition includes:  Wearing roomy, comfortable shoes that have a large toe box.  Putting orthotic devices in your shoes.  Pain medicines.  Physical therapy.  Icing the injured area.  Alternate between putting your foot in cold water then warm water. If your condition is severe, treatment may include:  Corticosteroid injections to relieve pain.  Surgery to remove bone spurs, fuse damaged bones together, or replace the entire joint. Follow these instructions at home:  Take over-the-counter and prescription medicines only as told by your health care provider.  Do not wear high heels or other restrictive footwear. Wear comfortable, supportive shoes that have a large toe box.  Wear orthotics as told by your health care provider, if this applies.  Put your feet in cold water for 30 seconds, then in warm water for 30 seconds. Alternate between the cold and warm water for 5 minutes. Do this several times a day or as told by your health care provider.  If directed, apply ice to the injured area.  Put ice in a plastic bag.  Place a towel between your skin and the bag.  Leave the ice on for 20 minutes, 2-3 times per day.  Do foot exercises as instructed by your health care provider or a physical therapist.  Keep all follow-up visits as told by your health care provider. This is important. Contact a health care provider if:  You notice bone spurs or growths on or around your big toe.  Your pain does not get better or it gets worse.  You have pain while resting.  You have pain in other parts of your body, such as your back, hip, or knee.  You start to limp. This information is not intended to replace advice given to you by your health care provider. Make sure you discuss any questions you have with your health care provider. Document Released: 08/15/2005 Document Revised: 01/21/2016 Document Reviewed: 04/22/2015  2017  Elsevier   Pre-Operative Instructions  Congratulations, you have decided to take an important step to improving your quality of life.  You can be assured that the doctors of [facility] will be with you every step of the way.  1. Plan to be at the surgery center/hospital at least 1 (one) hour prior to your scheduled time unless otherwise directed by  the surgical center/hospital staff.  You must have a responsible adult accompany you, remain during the surgery and drive you home.  Make sure you have directions to the surgical center/hospital and know how to get there on time. 2. For hospital based surgery you will need to obtain a history and physical form from your family physician within 1 month prior to the date of surgery- we will give you a form for you primary physician.  3. We make every effort to accommodate the date you request for surgery.  There are however, times where surgery dates or times have to be moved.  We will contact you as soon as possible if a change in schedule is required.   4. No Aspirin/Ibuprofen for one week before surgery.  If you are on aspirin, any non-steroidal anti-inflammatory medications (Mobic, Aleve, Ibuprofen) you should stop taking it 7 days prior to your surgery.  You make take Tylenol  For pain prior to surgery.  5. Medications- If you are taking daily heart and blood pressure medications, seizure, reflux, allergy, asthma, anxiety, pain or diabetes medications, make sure the surgery center/hospital is aware before the day of surgery so they may notify you which medications to take or avoid the day of surgery. 6. No food or drink after midnight the night before surgery unless directed otherwise by surgical center/hospital staff. 7. No alcoholic beverages 24 hours prior to surgery.  No smoking 24 hours prior to or 24 hours after surgery. 8. Wear loose pants or shorts- loose enough to fit over bandages, boots, and casts. 9. No slip on shoes, sneakers are  best. 10. Bring your boot with you to the surgery center/hospital.  Also bring crutches or a walker if your physician has prescribed it for you.  If you do not have this equipment, it will be provided for you after surgery. 11. If you have not been contracted by the surgery center/hospital by the day before your surgery, call to confirm the date and time of your surgery. 12. Leave-time from work may vary depending on the type of surgery you have.  Appropriate arrangements should be made prior to surgery with your employer. 13. Prescriptions will be provided immediately following surgery by your doctor.  Have these filled as soon as possible after surgery and take the medication as directed. 14. Remove nail polish on the operative foot. 15. Wash the night before surgery.  The night before surgery wash the foot and leg well with the antibacterial soap provided and water paying special attention to beneath the toenails and in between the toes.  Rinse thoroughly with water and dry well with a towel.  Perform this wash unless told not to do so by your physician.  Enclosed: 1 Ice pack (please put in freezer the night before surgery)   1 Hibiclens skin cleaner   Pre-op Instructions  If you have any questions regarding the instructions, do not hesitate to call our office.  [place]: [place], [place] [address] [place]: [address] [place], [place] [address] [phone]  [place]: [address], [place] [address] [phone]   Dr. Ila Mcgill DPM, Dr. Celesta Gentile DPM, Dr. Lanelle Bal DPM, Dr. Landis Martins DPM

## 2016-10-20 ENCOUNTER — Telehealth: Payer: Self-pay | Admitting: *Deleted

## 2016-10-20 NOTE — Telephone Encounter (Signed)
"  I was told to give you a call to schedule my surgery.  Please give me a call."  "I need to schedule my surgery with Dr. Paulla Dolly."  Do you have a date in mind that you would like to do it?  I think they said something about March 6."  He can do surgery on March 6.  You can register with the surgical center. We will get it scheduled.

## 2016-10-20 NOTE — Progress Notes (Signed)
Subjective:     Patient ID: [name] [name], male   DOB: 29-Dec-1990, 26 y.o.   MRN: [id number]  HPI patient states that he has chronic pain underneath his right big toe joint and also limitation of motion big toe joint with spurring and pain around the joint surface. States it's been present for over 6 months and he's had previous steroid injections immobilization and boot therapy. Left shows chronic discomfort in the posterior Achilles area that he stated had Achilles injection with not relief of symptoms. States the right foot is worse now than the left   Review of Systems  All other systems reviewed and are negative.      Objective:   Physical Exam  Constitutional: He is oriented to person, place, and time.  Cardiovascular: Intact distal pulses.   Musculoskeletal: Normal range of motion.  Neurological: He is oriented to person, place, and time.  Skin: Skin is warm.  Nursing note and vitals reviewed.  neurovascular status intact muscle strength adequate range of motion within normal limits with patient noted to have significant discomfort around the tibial sesamoidal complex right with pain and also limitation of motion big toe joint right with pain on the inside of the joint and also across the dorsal surface. 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 He cannot take the pain in the right and he wants a definitive solution due to failure to respond to conservative care and I've recommended hallux  limitus repair along with tibial sesamoidectomy. For the left I would like to avoid surgery and hopefully we can get him feeling better on the right it will not require and I did pad that area today to see what kind of relief that gives. For the right I allowed him to read consent form reviewing the alternative treatments complications associated with osteotomy of the first metatarsal right to plantarflex and removed spur and removal of tibial sesamoid. I did explain he'll have to incisions and a fairly close proximity and there is always a chance of dehiscence and he understands all this along with all other complications as listed in the consent form. He understands he'll be immobilized for approximately 4-6 weeks and the told recovery will take 6 months and there is no long-term guarantees this will solve his problem. Patient wants surgery signed consent form is given all preoperative instructions and is encouraged to call with any questions and will have surgery beginning of January when he checks his schedule. For the left and ultimately may require surgery but hopefully we can hold off   X-ray report indicates there is spurring around the lateral side of the first metatarsal right along with moderate narrowing of the joint surface elevation of the first metatarsal and what appears to be a crack of the tibial sesamoid right that is chronic in nature. Left does not show significant posterior spur

## 2016-11-01 ENCOUNTER — Encounter: Payer: Self-pay | Admitting: Podiatry

## 2016-11-01 DIAGNOSIS — M2011 Hallux valgus (acquired), right foot: Secondary | ICD-10-CM | POA: Diagnosis not present

## 2016-11-01 DIAGNOSIS — S92901S Unspecified fracture of right foot, sequela: Secondary | ICD-10-CM | POA: Diagnosis not present

## 2016-11-02 ENCOUNTER — Telehealth: Payer: Self-pay | Admitting: *Deleted

## 2016-11-02 NOTE — Telephone Encounter (Addendum)
Left message informing Chrys Racer that pt should stop the pain medication due to his reaction, take Ibuprofen 800mg  every 8 hours and could continue the phenergan for nausea and had antihistamine properties also and to call for more instructions. 11/04/2016-Pt's mtr, Tammy states after Chrys Racer had a seizure, they kinda of missed or forgot a few things. Tammy asked if pt could bear weight on the surgery foot, and I told her only toe of cam boot to balance with crutches, and not to be up on the foot or dangling more than 15 minutes/hour. Tammy states pt is not taking Percocet, only Ibuprofen, how often to ice. I told her after 72 hours ice is for comfort and swelling. I told her to call with concerns.12/06/2016-Lisa - [employer] short-term disability states they need to be contacted with pt's restrictions and limitations.12/20/2016-Pt's wife, Chrys Racer states pt was released to go back to work and returned to work last night, pt came home and foot is swollen and painful is he suppose to push through that. Dr. Cannon Kettle states pt may have over done it, needs to be out of work the rest of the week, rest, ice and elevate. I informed pt's wife and she asked if pt could return to decrease work day hours when returns to work on 12/26/2016. I spoke with Dr. Cannon Kettle and she states pt would probably benefit from returning to work at reduced work day hours, begin with 1/2 day work schedule for 1 week.

## 2016-11-10 ENCOUNTER — Ambulatory Visit (INDEPENDENT_AMBULATORY_CARE_PROVIDER_SITE_OTHER): Payer: 59

## 2016-11-10 ENCOUNTER — Ambulatory Visit (INDEPENDENT_AMBULATORY_CARE_PROVIDER_SITE_OTHER): Payer: 59 | Admitting: Podiatry

## 2016-11-10 ENCOUNTER — Encounter: Payer: Self-pay | Admitting: Podiatry

## 2016-11-10 VITALS — Temp 98.8°F

## 2016-11-10 DIAGNOSIS — Z9889 Other specified postprocedural states: Secondary | ICD-10-CM | POA: Diagnosis not present

## 2016-11-10 DIAGNOSIS — M205X1 Other deformities of toe(s) (acquired), right foot: Secondary | ICD-10-CM

## 2016-11-11 NOTE — Progress Notes (Signed)
Subjective:     Patient ID: [name] [name], male   DOB: [birth year], 26 y.o.   MRN: [id number]  HPI patient states she's doing real well with his right foot with minimal discomfort noted   Review of Systems     Objective:   Physical Exam Neurovascular status intact with 2 well-healing surgical sites 1 dorsal first metatarsal 1 medial with stitches intact plantar and good range of motion of the first MPJ with no crepitus and no pain when I palpated the tibial sesamoid complex right    Assessment:     Doing well post osteotomy and sesamoidal removal right with skin healing well    Plan:     X-ray reviewed and I applied sterile dressing and instructed on continued compression elevation and immobilization. Instructed on range of motion exercises reappoint 3 weeks to recheck  X-ray report indicates the osteotomy is healing well pins are in place joint congruence with no indications of movement

## 2016-11-21 ENCOUNTER — Ambulatory Visit (INDEPENDENT_AMBULATORY_CARE_PROVIDER_SITE_OTHER): Payer: 59 | Admitting: Podiatry

## 2016-11-21 ENCOUNTER — Ambulatory Visit (INDEPENDENT_AMBULATORY_CARE_PROVIDER_SITE_OTHER): Payer: 59

## 2016-11-21 ENCOUNTER — Encounter: Payer: Self-pay | Admitting: Podiatry

## 2016-11-21 DIAGNOSIS — M205X1 Other deformities of toe(s) (acquired), right foot: Secondary | ICD-10-CM

## 2016-11-22 NOTE — Progress Notes (Signed)
Subjective:     Patient ID: [name] [name], male   DOB: [birth year], 26 y.o.   MRN: [id number]  HPI patient presents stating he was having some pain in his foot and swelling and he wanted to have it checked. States he wants to get more   Review of Systems     Objective:   Physical Exam  Neurovascular status intact negative Homans sign was noted with patient found have well-healing surgical sites first metatarsal and tibial sesamoid. There is mild edema noted with good alignment with no other pathology noted    Assessment:     Doing well overall with moderate swelling which may be due to activity    Plan:     Precautionary x-ray taken and stitches removed from the plantar side and instructed on range of motion exercises and gradual increase in activity with hopeful return to shoe gear in the next 2 weeks  X-rays indicate that the osteotomy is healing well with pins intact joint congruence and good alignment

## 2016-12-19 ENCOUNTER — Encounter: Payer: Self-pay | Admitting: Podiatry

## 2016-12-19 ENCOUNTER — Ambulatory Visit (INDEPENDENT_AMBULATORY_CARE_PROVIDER_SITE_OTHER): Payer: 59 | Admitting: Podiatry

## 2016-12-19 ENCOUNTER — Ambulatory Visit (INDEPENDENT_AMBULATORY_CARE_PROVIDER_SITE_OTHER): Payer: 59

## 2016-12-19 DIAGNOSIS — Z9889 Other specified postprocedural states: Secondary | ICD-10-CM | POA: Diagnosis not present

## 2016-12-19 DIAGNOSIS — M7662 Achilles tendinitis, left leg: Secondary | ICD-10-CM

## 2016-12-19 DIAGNOSIS — M205X1 Other deformities of toe(s) (acquired), right foot: Secondary | ICD-10-CM

## 2016-12-19 MED ORDER — DICLOFENAC SODIUM 75 MG PO TBEC: 75.0000 mg | DELAYED_RELEASE_TABLET | Freq: Two times a day (BID) | ORAL | 2 refills | Status: DC

## 2016-12-19 NOTE — Progress Notes (Signed)
Subjective:    Patient ID: [name] [name], male   DOB: 26 y.o.   MRN: [id number]   HPI patient presents stating that he's doing really well with minimal discomfort and is found to have good alignment and minimal plantar pain with some irritation of the plantar incision site    ROS      Objective:  Physical Exam Neurovascular status intact negative Homans sign was noted with patient found to have good alignment of the first MPJ with good range of motion that's slightly restricted dorsally that he will need to work on. There is no crepitus in the joint    Assessment:     Doing well overall with discomfort that's associated with him needing to be more active against his first MPJ    Plan:    H&P x-ray reviewed and recommended the range of motion exercises to be done. Patient wants to continue with this treatment plan and will continue with gradual increase in shoe gear but will not do anything of intense this yet. Reappoint for [place] to recheck 4 weeks or earlier if necessary  X-ray indicates the osteotomy is healing well with pins in place joint congruence no spurring and tibial sesamoid that's been excised properly

## 2016-12-20 ENCOUNTER — Encounter: Payer: Self-pay | Admitting: *Deleted

## 2016-12-20 NOTE — Telephone Encounter (Signed)
No work this week, return reduced hours next week. Ice and elevate and return to office if continues to be painful

## 2016-12-23 ENCOUNTER — Encounter: Payer: Self-pay | Admitting: Podiatry

## 2017-01-24 ENCOUNTER — Encounter: Payer: Self-pay | Admitting: Internal Medicine

## 2017-01-24 ENCOUNTER — Ambulatory Visit (INDEPENDENT_AMBULATORY_CARE_PROVIDER_SITE_OTHER): Payer: 59 | Admitting: Internal Medicine

## 2017-01-24 VITALS — BP 116/78 | HR 60 | Temp 98.5°F | Ht 71.0 in | Wt 176.0 lb

## 2017-01-24 DIAGNOSIS — L299 Pruritus, unspecified: Secondary | ICD-10-CM

## 2017-01-24 DIAGNOSIS — Z0000 Encounter for general adult medical examination without abnormal findings: Secondary | ICD-10-CM

## 2017-01-24 DIAGNOSIS — Z0001 Encounter for general adult medical examination with abnormal findings: Secondary | ICD-10-CM | POA: Diagnosis not present

## 2017-01-24 MED ORDER — VITAMIN D 1000 UNITS PO TABS: 1000.0000 [IU] | ORAL_TABLET | Freq: Every day | ORAL | 11 refills | Status: AC

## 2017-01-24 MED ORDER — CLOBETASOL PROPIONATE 0.05 % EX SOLN
1.0000 | Freq: Two times a day (BID) | CUTANEOUS | 2 refills | Status: DC
Start: 2017-01-24 — End: 2018-08-01

## 2017-01-24 NOTE — Assessment & Plan Note (Signed)
Clobetasol sol

## 2017-01-24 NOTE — Assessment & Plan Note (Addendum)
We discussed age appropriate health related issues, including available/recomended screening tests and vaccinations. We discussed a need for adhering to healthy diet and exercise. Labs were ordered to be later reviewed . All questions were answered. tDAP next year Labs - in a few days

## 2017-01-24 NOTE — Progress Notes (Signed)
Subjective:  Patient ID: [name] [name], male    DOB: 1990-10-24  Age: 26 y.o. MRN: [id number]  CC: No chief complaint on file.   HPI [name] [name] presents for a well exam. Applying to [employer] dpt - physical endurance test is tomorrow. Working out 4/week - no issues... C/o itchy scalp and hair loss  Outpatient Medications Prior to Visit  Medication Sig Dispense Refill  . diclofenac (VOLTAREN) 75 MG EC tablet Take 1 tablet (75 mg total) by mouth 2 (two) times daily. 50 tablet 2  . ibuprofen (ADVIL,MOTRIN) 800 MG tablet Take 800 mg by mouth every 8 (eight) hours as needed.     No facility-administered medications prior to visit.     ROS Review of Systems  Constitutional: Negative for appetite change, fatigue and unexpected weight change.  HENT: Negative for congestion, nosebleeds, sneezing, sore throat and trouble swallowing.   Eyes: Negative for itching and visual disturbance.  Respiratory: Negative for cough.   Cardiovascular: Negative for chest pain, palpitations and leg swelling.  Gastrointestinal: Negative for abdominal distention, blood in stool, diarrhea and nausea.  Genitourinary: Negative for frequency and hematuria.  Musculoskeletal: Negative for back pain, gait problem, joint swelling and neck pain.  Skin: Negative for rash.  Neurological: Negative for dizziness, tremors, speech difficulty and weakness.  Psychiatric/Behavioral: Negative for agitation, dysphoric mood, sleep disturbance and suicidal ideas. The patient is not nervous/anxious.     Objective:  BP 116/78 (BP Location: Left Arm, Patient Position: Sitting, Cuff Size: Normal)   Pulse 60   Temp 98.5 F (36.9 C) (Oral)   Ht 5\' 11"  (1.803 m)   Wt 176 lb (79.8 kg)   SpO2 99%   BMI 24.55 kg/m   BP Readings from Last 3 Encounters:  01/24/17 116/78  10/19/16 118/65  07/14/16 122/88    Wt Readings from Last 3 Encounters:  01/24/17 176 lb (79.8 kg)  10/19/16 160 lb (72.6 kg)  07/14/16 162 lb  (73.5 kg)    Physical Exam  Constitutional: He is oriented to person, place, and time. He appears well-developed. No distress.  NAD  HENT:  Mouth/Throat: Oropharynx is clear and moist.  Eyes: Conjunctivae are normal. Pupils are equal, round, and reactive to light.  Neck: Normal range of motion. No JVD present. No thyromegaly present.  Cardiovascular: Normal rate, regular rhythm, normal heart sounds and intact distal pulses.  Exam reveals no gallop and no friction rub.   No murmur heard. Pulmonary/Chest: Effort normal and breath sounds normal. No respiratory distress. He has no wheezes. He has no rales. He exhibits no tenderness.  Abdominal: Soft. Bowel sounds are normal. He exhibits no distension and no mass. There is no tenderness. There is no rebound and no guarding.  Musculoskeletal: Normal range of motion. He exhibits no edema or tenderness.  Lymphadenopathy:    He has no cervical adenopathy.  Neurological: He is alert and oriented to person, place, and time. He has normal reflexes. No cranial nerve deficit. He exhibits normal muscle tone. He displays a negative Romberg sign. Coordination and gait normal.  Skin: Skin is warm and dry. No rash noted.  Psychiatric: He has a normal mood and affect. His behavior is normal. Judgment and thought content normal.    Lab Results  Component Value Date   WBC 4.8 06/25/2015   HGB 15.0 06/25/2015   HCT 43.2 06/25/2015   PLT 184 06/25/2015   GLUCOSE 92 06/25/2015   CHOL 132 11/10/2014   TRIG 120.0 11/10/2014  HDL 48.70 11/10/2014   LDLCALC 59 11/10/2014   ALT 26 06/25/2015   AST 20 06/25/2015   NA 140 06/25/2015   K 3.9 06/25/2015   CL 101 06/25/2015   CREATININE 1.17 06/25/2015   BUN 17 06/25/2015   CO2 27 06/25/2015   TSH 1.09 06/05/2015   INR 1.07 01/21/2010    Ct Abdomen Pelvis W Contrast  Result Date: 06/25/2015 CLINICAL DATA:  Right lower quadrant tenderness on exam. Low-grade fever for 2-3 weeks. Evaluate for appendicitis  EXAM: CT ABDOMEN AND PELVIS WITH CONTRAST TECHNIQUE: Multidetector CT imaging of the abdomen and pelvis was performed using the standard protocol following bolus administration of intravenous contrast. CONTRAST:  150mL OMNIPAQUE IOHEXOL 300 MG/ML  SOLN COMPARISON:  06/23/2008 FINDINGS: Lower chest and abdominal wall:  No contributory findings. Hepatobiliary: No focal liver abnormality.No evidence of biliary obstruction or stone. Pancreas: Unremarkable. Spleen: Stable size and appearance Adrenals/Urinary Tract: Negative adrenals. No hydronephrosis or stone. Unremarkable bladder. Reproductive:No pathologic findings. Stomach/Bowel:  No obstruction. No appendicitis. Vascular/Lymphatic: No acute vascular abnormality. No mass or adenopathy. Peritoneal: No ascites or pneumoperitoneum. Musculoskeletal: No acute abnormalities. IMPRESSION: Negative. No appendicitis or other explanation for right lower quadrant pain. Electronically Signed   By: Monte Fantasia M.D.   On: 06/25/2015 16:18    Assessment & Plan:   There are no diagnoses linked to this encounter. I have discontinued Mr. [name] [name]'s ibuprofen and diclofenac.  No orders of the defined types were placed in this encounter.    Follow-up: No Follow-up on file.  Walker Kehr, MD

## 2017-01-24 NOTE — Patient Instructions (Addendum)
Vit B complex Rogaine Propecia

## 2017-02-02 NOTE — Progress Notes (Signed)
1. Removal tibial sesmoid right 2. Repair 1st MPJ right with osteotomy and pin fixation

## 2017-02-13 ENCOUNTER — Ambulatory Visit: Payer: 59

## 2017-02-16 ENCOUNTER — Ambulatory Visit: Payer: 59

## 2017-02-17 ENCOUNTER — Ambulatory Visit: Payer: 59

## 2017-02-24 ENCOUNTER — Ambulatory Visit (INDEPENDENT_AMBULATORY_CARE_PROVIDER_SITE_OTHER): Payer: 59 | Admitting: Podiatry

## 2017-02-24 ENCOUNTER — Ambulatory Visit (INDEPENDENT_AMBULATORY_CARE_PROVIDER_SITE_OTHER): Payer: 59

## 2017-02-24 DIAGNOSIS — M7662 Achilles tendinitis, left leg: Secondary | ICD-10-CM

## 2017-02-24 DIAGNOSIS — M205X1 Other deformities of toe(s) (acquired), right foot: Secondary | ICD-10-CM

## 2017-02-24 NOTE — Patient Instructions (Signed)

## 2017-06-14 NOTE — Progress Notes (Signed)
HPI patient presents stating that he's doing really well with minimal discomfort and is found to have good alignment and minimal plantar pain with some irritation of the plantar incision site    ROS      Objective:  Physical Exam Neurovascular status intact negative Homans sign was noted with patient found to have good alignment of the first MPJ with good range of motion that's slightly restricted dorsally that he will need to work on. There is no crepitus in the joint    Assessment:     Doing well overall with discomfort that's associated with him needing to be more active against his first MPJ    Plan:    H&P x-ray reviewed and recommended the range of motion exercises to be done. Patient wants to continue with this treatment plan and will continue with gradual increase in shoe gear but will not do anything of intense this yet. Reappoint for [place] to recheck 4 weeks or earlier if necessary  X-ray indicates the osteotomy is healing well with pins in place joint congruence no spurring and tibial sesamoid that's been excised properly

## 2017-11-03 ENCOUNTER — Ambulatory Visit: Payer: 59 | Admitting: Internal Medicine

## 2017-11-03 ENCOUNTER — Encounter: Payer: Self-pay | Admitting: Internal Medicine

## 2017-11-03 ENCOUNTER — Ambulatory Visit: Payer: Self-pay | Admitting: *Deleted

## 2017-11-03 VITALS — BP 100/70 | HR 50 | Temp 98.9°F | Ht 71.0 in | Wt 186.5 lb

## 2017-11-03 DIAGNOSIS — Z23 Encounter for immunization: Secondary | ICD-10-CM

## 2017-11-03 DIAGNOSIS — R61 Generalized hyperhidrosis: Secondary | ICD-10-CM | POA: Insufficient documentation

## 2017-11-03 DIAGNOSIS — N451 Epididymitis: Secondary | ICD-10-CM | POA: Diagnosis not present

## 2017-11-03 MED ORDER — DOXYCYCLINE HYCLATE 100 MG PO TABS: 100.0000 mg | ORAL_TABLET | Freq: Two times a day (BID) | ORAL | 0 refills | Status: DC

## 2017-11-03 NOTE — Telephone Encounter (Signed)
Pt calls with complaints of lower abdominal pain and having a difficult time initiating his stream of urine; he describes it as a "crampy" that has worsened since last Friday; initially started as pain in his right testicle (more of a discomfort than a pain) and worked its way to his right hip; he states that he feels like he "is not emptying his bladder and is going to the bathroom almost hourly" nurse triage initiated and recommendations made per protocol to include going to ED or PCP/AAlternative with approval; spoke with Gareth Eagle regarding scheduling; pt offered and accepted appointment with Dr Alain Marion, [facility] today at 87; pt verbalizes understanding.  Reason for Disposition . [1] Pain in the scrotum or testicle AND [2] present > 1 hour  Answer Assessment - Initial Assessment Questions 1. LOCATION: "Where does it hurt?"      lower abdomen; cramping from testilce 2. RADIATION: "Does the pain shoot anywhere else?" (e.g., chest, back)     Right hip 3. ONSET: "When did the pain begin?" (Minutes, hours or days ago)      7 days ago 4. SUDDEN: "Gradual or sudden onset?"     Gradual but worsen in the past couple of days 5. PATTERN "Does the pain come and go, or is it constant?"    - If constant: "Is it getting better, staying the same, or worsening?"      (Note: Constant means the pain never goes away completely; most serious pain is constant and it progresses)     - If intermittent: "How long does it last?" "Do you have pain now?"     (Note: Intermittent means the pain goes away completely between bouts)     constant 6. SEVERITY: "How bad is the pain?"  (e.g., Scale 1-10; mild, moderate, or severe)    - MILD (1-3): doesn't interfere with normal activities, abdomen soft and not tender to touch     - MODERATE (4-7): interferes with normal activities or awakens from sleep, tender to touch     - SEVERE (8-10): excruciating pain, doubled over, unable to do any normal activities       Mild to  moderate (3-4 out of 10) 7. RECURRENT SYMPTOM: "Have you ever had this type of abdominal pain before?" If so, ask: "When was the last time?" and "What happened that time?"      no 8. CAUSE: "What do you think is causing the abdominal pain?"     "could be urinary infection" 9. RELIEVING/AGGRAVATING FACTORS: "What makes it better or worse?" (e.g., movement, antacids, bowel movement)  none 10. OTHER SYMPTOMS: "Has there been any vomiting, diarrhea, constipation, or urine problems?"       Problems with urination  Protocols used: ABDOMINAL PAIN - MALE-A-AH

## 2017-11-03 NOTE — Patient Instructions (Addendum)
Support underwear Advil 400 mg twice a day x1 week    Epididymitis Epididymitis is swelling (inflammation) of the epididymis. The epididymis is a cord-like structure that is located along the top and back part of the testicle. It collects and stores sperm from the testicle. This condition can also cause pain and swelling of the testicle and scrotum. Symptoms usually start suddenly (acute epididymitis). Sometimes epididymitis starts gradually and lasts for a while (chronic epididymitis). This type may be harder to treat. What are the causes? In men 61 and younger, this condition is usually caused by a bacterial infection or sexually transmitted disease (STD), such as:  Gonorrhea.  Chlamydia.  In men 39 and older who do not have anal sex, this condition is usually caused by bacteria from a blockage or abnormalities in the urinary system. These can result from:  Having a tube placed into the bladder (urinary catheter).  Having an enlarged or inflamed prostate gland.  Having recent urinary tract surgery.  In men who have a condition that weakens the body's defense system (immune system), such as HIV, this condition can be caused by:  Other bacteria, including tuberculosis and syphilis.  Viruses.  Fungi.  Sometimes this condition occurs without infection. That may happen if urine flows backward into the epididymis after heavy lifting or straining. What increases the risk? This condition is more likely to develop in men:  Who have unprotected sex with more than one partner.  Who have anal sex.  Who have recently had surgery.  Who have a urinary catheter.  Who have urinary problems.  Who have a suppressed immune system.  What are the signs or symptoms? This condition usually begins suddenly with chills, fever, and pain behind the scrotum and in the testicle. Other symptoms include:  Swelling of the scrotum, testicle, or both.  Pain whenejaculatingor urinating.  Pain  in the back or belly.  Nausea.  Itching and discharge from the penis.  Frequent need to pass urine.  Redness and tenderness of the scrotum.  How is this diagnosed? Your health care provider can diagnose this condition based on your symptoms and medical history. Your health care provider will also do a physical exam to ask about your symptoms and check your scrotum and testicle for swelling, pain, and redness. You may also have other tests, including:  Examination of discharge from the penis.  Urine tests for infections, such as STDs.  Your health care provider may test you for other STDs, including HIV. How is this treated? Treatment for this condition depends on the cause. If your condition is caused by a bacterial infection, oral antibiotic medicine may be prescribed. If the bacterial infection has spread to your blood, you may need to receive IV antibiotics. Nonbacterial epididymitis is treated with home care that includes bed rest and elevation of the scrotum. Surgery may be needed to treat:  Bacterial epididymitis that causes pus to build up in the scrotum (abscess).  Chronic epididymitis that has not responded to other treatments.  Follow these instructions at home: Medicines  Take over-the-counter and prescription medicines only as told by your health care provider.  If you were prescribed an antibiotic medicine, take it as told by your health care provider. Do not stop taking the antibiotic even if your condition improves. Sexual Activity  If your epididymitis was caused by an STD, avoid sexual activity until your treatment is complete.  Inform your sexual partner or partners if you test positive for an STD. They may  need to be treated.Do not engage in sexual activity with your partner or partners until their treatment is completed. General instructions  Return to your normal activities as told by your health care provider. Ask your health care provider what activities  are safe for you.  Keep your scrotum elevated and supported while resting. Ask your health care provider if you should wear a scrotal support, such as a jockstrap. Wear it as told by your health care provider.  If directed, apply ice to the affected area: ? Put ice in a plastic bag. ? Place a towel between your skin and the bag. ? Leave the ice on for 20 minutes, 2-3 times per day.  Try taking a sitz bath to help with discomfort. This is a warm water bath that is taken while you are sitting down. The water should only come up to your hips and should cover your buttocks. Do this 3-4 times per day or as told by your health care provider.  Keep all follow-up visits as told by your health care provider. This is important. Contact a health care provider if:  You have a fever.  Your pain medicine is not helping.  Your pain is getting worse.  Your symptoms do not improve within three days. This information is not intended to replace advice given to you by your health care provider. Make sure you discuss any questions you have with your health care provider. Document Released: 08/12/2000 Document Revised: 01/21/2016 Document Reviewed: 12/31/2014 Elsevier Interactive Patient Education  2018 [employer].

## 2017-11-03 NOTE — Assessment & Plan Note (Signed)
likely related to epididymitis Treat w/Doxy Labs if not resolved in 2 weeks

## 2017-11-03 NOTE — Progress Notes (Signed)
Subjective:  Patient ID: [name] [name], male    DOB: 21-Jun-1991  Age: 27 y.o. MRN: [id number]  CC: No chief complaint on file.   HPI AZEEZ DUNKER presents for R testis mild discomfort x weeks C/o sweats x 1.5 mo ago  Outpatient Medications Prior to Visit  Medication Sig Dispense Refill  . cholecalciferol (VITAMIN D) 1000 units tablet Take 1 tablet (1,000 Units total) by mouth daily. 30 tablet 11  . clobetasol (TEMOVATE) 0.05 % external solution Apply 1 application topically 2 (two) times daily. 100 mL 2  . ondansetron (ZOFRAN) 4 MG tablet Take 4 mg by mouth every 8 (eight) hours as needed for nausea or vomiting.    . ondansetron (ZOFRAN-ODT) 8 MG disintegrating tablet     . oxyCODONE-acetaminophen (PERCOCET) 10-325 MG tablet Take 1 tablet by mouth every 4 (four) hours as needed for pain.     No facility-administered medications prior to visit.     ROS Review of Systems  Constitutional: Positive for chills. Negative for appetite change, fatigue and unexpected weight change.  HENT: Negative for congestion, nosebleeds, sneezing, sore throat and trouble swallowing.   Eyes: Negative for itching and visual disturbance.  Respiratory: Negative for cough.   Cardiovascular: Negative for chest pain, palpitations and leg swelling.  Gastrointestinal: Negative for abdominal distention, blood in stool, diarrhea and nausea.  Genitourinary: Positive for testicular pain. Negative for frequency and hematuria.  Musculoskeletal: Negative for back pain, gait problem, joint swelling and neck pain.  Skin: Negative for rash.  Neurological: Negative for dizziness, tremors, speech difficulty and weakness.  Psychiatric/Behavioral: Negative for agitation, dysphoric mood and sleep disturbance. The patient is not nervous/anxious.     Objective:  BP 100/70 (BP Location: Left Arm, Patient Position: Sitting, Cuff Size: Normal)   Pulse (!) 50   Temp 98.9 F (37.2 C) (Oral)   Ht 5\' 11"  (1.803 m)    Wt 186 lb 8 oz (84.6 kg)   SpO2 100%   BMI 26.01 kg/m   BP Readings from Last 3 Encounters:  11/03/17 100/70  01/24/17 116/78  10/19/16 118/65    Wt Readings from Last 3 Encounters:  11/03/17 186 lb 8 oz (84.6 kg)  01/24/17 176 lb (79.8 kg)  10/19/16 160 lb (72.6 kg)    Physical Exam  Constitutional: He is oriented to person, place, and time. He appears well-developed. No distress.  NAD  HENT:  Mouth/Throat: Oropharynx is clear and moist.  Eyes: Conjunctivae are normal. Pupils are equal, round, and reactive to light.  Neck: Normal range of motion. No JVD present. No thyromegaly present.  Cardiovascular: Normal rate, regular rhythm, normal heart sounds and intact distal pulses. Exam reveals no gallop and no friction rub.  No murmur heard. Pulmonary/Chest: Effort normal and breath sounds normal. No respiratory distress. He has no wheezes. He has no rales. He exhibits no tenderness.  Abdominal: Soft. Bowel sounds are normal. He exhibits no distension and no mass. There is no tenderness. There is no rebound and no guarding.  Genitourinary: Penis normal. No penile tenderness.  Musculoskeletal: Normal range of motion. He exhibits no edema or tenderness.  Lymphadenopathy:    He has no cervical adenopathy.  Neurological: He is alert and oriented to person, place, and time. He has normal reflexes. No cranial nerve deficit. He exhibits normal muscle tone. He displays a negative Romberg sign. Coordination and gait normal.  Skin: Skin is warm and dry. No rash noted.  Psychiatric: He has a normal mood and  affect. His behavior is normal. Judgment and thought content normal.  R testis tender; no mass  Lab Results  Component Value Date   WBC 4.8 06/25/2015   HGB 15.0 06/25/2015   HCT 43.2 06/25/2015   PLT 184 06/25/2015   GLUCOSE 92 06/25/2015   CHOL 132 11/10/2014   TRIG 120.0 11/10/2014   HDL 48.70 11/10/2014   LDLCALC 59 11/10/2014   ALT 26 06/25/2015   AST 20 06/25/2015   NA 140  06/25/2015   K 3.9 06/25/2015   CL 101 06/25/2015   CREATININE 1.17 06/25/2015   BUN 17 06/25/2015   CO2 27 06/25/2015   TSH 1.09 06/05/2015   INR 1.07 01/21/2010    Ct Abdomen Pelvis W Contrast  Result Date: 06/25/2015 CLINICAL DATA:  Right lower quadrant tenderness on exam. Low-grade fever for 2-3 weeks. Evaluate for appendicitis EXAM: CT ABDOMEN AND PELVIS WITH CONTRAST TECHNIQUE: Multidetector CT imaging of the abdomen and pelvis was performed using the standard protocol following bolus administration of intravenous contrast. CONTRAST:  151mL OMNIPAQUE IOHEXOL 300 MG/ML  SOLN COMPARISON:  06/23/2008 FINDINGS: Lower chest and abdominal wall:  No contributory findings. Hepatobiliary: No focal liver abnormality.No evidence of biliary obstruction or stone. Pancreas: Unremarkable. Spleen: Stable size and appearance Adrenals/Urinary Tract: Negative adrenals. No hydronephrosis or stone. Unremarkable bladder. Reproductive:No pathologic findings. Stomach/Bowel:  No obstruction. No appendicitis. Vascular/Lymphatic: No acute vascular abnormality. No mass or adenopathy. Peritoneal: No ascites or pneumoperitoneum. Musculoskeletal: No acute abnormalities. IMPRESSION: Negative. No appendicitis or other explanation for right lower quadrant pain. Electronically Signed   By: Monte Fantasia M.D.   On: 06/25/2015 16:18    Assessment & Plan:   There are no diagnoses linked to this encounter. I am having Tierre M. Mahany maintain his cholecalciferol, clobetasol, oxyCODONE-acetaminophen, ondansetron, and ondansetron.  No orders of the defined types were placed in this encounter.    Follow-up: No Follow-up on file.  Walker Kehr, MD

## 2017-11-03 NOTE — Assessment & Plan Note (Signed)
R [place] Doxy x 10 d Support underwear

## 2017-11-20 ENCOUNTER — Other Ambulatory Visit: Payer: Self-pay

## 2017-11-20 DIAGNOSIS — N451 Epididymitis: Secondary | ICD-10-CM

## 2017-11-28 ENCOUNTER — Ambulatory Visit
Admission: RE | Admit: 2017-11-28 | Discharge: 2017-11-28 | Disposition: A | Discharge status: Home / Self Care | Payer: 59 | Source: Ambulatory Visit | Attending: Internal Medicine | Admitting: Internal Medicine

## 2017-11-28 DIAGNOSIS — N451 Epididymitis: Secondary | ICD-10-CM

## 2018-07-12 ENCOUNTER — Ambulatory Visit: Payer: BLUE CROSS/BLUE SHIELD | Admitting: Family Medicine

## 2018-07-12 ENCOUNTER — Encounter: Payer: Self-pay | Admitting: Family Medicine

## 2018-07-12 VITALS — BP 124/80 | HR 72 | Temp 98.5°F | Ht 71.0 in | Wt 181.8 lb

## 2018-07-12 DIAGNOSIS — J209 Acute bronchitis, unspecified: Secondary | ICD-10-CM | POA: Diagnosis not present

## 2018-07-12 DIAGNOSIS — H60501 Unspecified acute noninfective otitis externa, right ear: Secondary | ICD-10-CM

## 2018-07-12 MED ORDER — PROMETHAZINE-DM 6.25-15 MG/5ML PO SYRP: 5.0000 mL | ORAL_SOLUTION | Freq: Four times a day (QID) | ORAL | 0 refills | Status: DC | PRN

## 2018-07-12 MED ORDER — DOXYCYCLINE HYCLATE 100 MG PO TABS: 100.0000 mg | ORAL_TABLET | Freq: Two times a day (BID) | ORAL | 0 refills | Status: DC

## 2018-07-12 MED ORDER — OFLOXACIN 0.3 % OT SOLN: 10.0000 [drp] | Freq: Two times a day (BID) | OTIC | 0 refills | Status: DC

## 2018-07-12 NOTE — Patient Instructions (Signed)
Please drink plenty of water so that your urine is pale yellow or clear. Also, get plenty of rest, use tylenol or ibuprofen as needed for discomfort and follow up if symptoms do not improve in 3 to 4 days, worsen, or you develop a fever >101.  Antibiotic has been provided for you due to length of symptoms.  Cough syrup can cause drowsiness.  Please use drops in right ear and if symptoms do not improve or worsen, please follow up for further evaluation.    Otitis Externa Otitis externa is an infection of the outer ear canal. The outer ear canal is the area between the outside of the ear and the eardrum. Otitis externa is sometimes called "swimmer's ear." Follow these instructions at home:  If you were given antibiotic ear drops, use them as told by your doctor. Do not stop using them even if your condition gets better.  Take over-the-counter and prescription medicines only as told by your doctor.  Keep all follow-up visits as told by your doctor. This is important. How is this prevented?  Keep your ear dry. Use the corner of a towel to dry your ear after you swim or bathe.  Try not to scratch or put things in your ear. Doing these things makes it easier for germs to grow in your ear.  Avoid swimming in lakes, dirty water, or pools that may not have the right amount of a chemical called chlorine.  Consider making ear drops and putting 3 or 4 drops in each ear after you swim. Ask your doctor about how you can make ear drops. Contact a doctor if:  You have a fever.  After 3 days your ear is still red, swollen, or painful.  After 3 days you still have pus coming from your ear.  Your redness, swelling, or pain gets worse.  You have a really bad headache.  You have redness, swelling, pain, or tenderness behind your ear. This information is not intended to replace advice given to you by your health care provider. Make sure you discuss any questions you have with your health care  provider. Document Released: 02/01/2008 Document Revised: 09/10/2015 Document Reviewed: 05/25/2015 Elsevier Interactive Patient Education  2018 [employer].  Acute Bronchitis, Adult Acute bronchitis is when air tubes (bronchi) in the lungs suddenly get swollen. The condition can make it hard to breathe. It can also cause these symptoms:  A cough.  Coughing up clear, yellow, or green mucus.  Wheezing.  Chest congestion.  Shortness of breath.  A fever.  Body aches.  Chills.  A sore throat.  Follow these instructions at home: Medicines  Take over-the-counter and prescription medicines only as told by your doctor.  If you were prescribed an antibiotic medicine, take it as told by your doctor. Do not stop taking the antibiotic even if you start to feel better. General instructions  Rest.  Drink enough fluids to keep your pee (urine) clear or pale yellow.  Avoid smoking and secondhand smoke. If you smoke and you need help quitting, ask your doctor. Quitting will help your lungs heal faster.  Use an inhaler, cool mist vaporizer, or humidifier as told by your doctor.  Keep all follow-up visits as told by your doctor. This is important. How is this prevented? To lower your risk of getting this condition again:  Wash your hands often with soap and water. If you cannot use soap and water, use hand sanitizer.  Avoid contact with people who have cold  symptoms.  Try not to touch your hands to your mouth, nose, or eyes.  Make sure to get the flu shot every year.  Contact a doctor if:  Your symptoms do not get better in 2 weeks. Get help right away if:  You cough up blood.  You have chest pain.  You have very bad shortness of breath.  You become dehydrated.  You faint (pass out) or keep feeling like you are going to pass out.  You keep throwing up (vomiting).  You have a very bad headache.  Your fever or chills gets worse. This information is not intended to  replace advice given to you by your health care provider. Make sure you discuss any questions you have with your health care provider. Document Released: 02/01/2008 Document Revised: 03/23/2016 Document Reviewed: 02/03/2016 Elsevier Interactive Patient Education  [employer].

## 2018-07-12 NOTE — Progress Notes (Signed)
Patient ID: TAYTUM WHELLER, male   DOB: [birth year], 27 y.o.   MRN: [id number]  PCP: Cassandria Anger, MD  Subjective:  LETRELL ATTWOOD is a 27 y.o. year old very pleasant male patient who presents with symptoms including nasal congestion, rhinitis, scratchy throat, cough that is productive of green mucous. Right ear pressure has been present -started: over 2 to 3 weeks ago, symptoms are not improving.  -previous treatments: Vitamin C and no other OTC medications at this time. -sick contacts/travel/risks: denies flu exposure. 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 clobetasol (TEMOVATE) 0.05 % external solution Apply 1 application topically 2 (two) times daily. (Patient not taking: Reported on 07/12/2018) 100 mL 2  . doxycycline (VIBRA-TABS) 100 MG tablet Take 1 tablet (100 mg total) by mouth 2 (two) times daily. (Patient not taking: Reported on 07/12/2018) 20 tablet 0   No current facility-administered medications for this visit.     Objective: BP 124/80 (BP Location: Left Arm, Patient Position: Sitting, Cuff Size: Normal)   Pulse 72   Temp 98.5 F (36.9 C) (Oral)   Ht 5\' 11"  (1.803 m)   Wt 181 lb 12.8 oz (82.5 kg)   SpO2 97%   BMI 25.36 kg/m  Gen: NAD, resting comfortably HEENT: Turbinates mildly erythematous, TMs normal bilaterally, pharynx mildly erythematous with no exudate or edema, Mild frontal sinus tenderness present. Tenderness with tragal pressure and manipulation of auricle to right ear. Mild erythema present in right ear canal, minimal edema present.  CV: RRR no murmurs rubs or gallops Lungs: CTAB no crackles, wheeze, rhonchi Abdomen: soft/nontender/nondistended/normal bowel sounds. No rebound or guarding.  Ext: no edema Skin:  warm, dry, no rash Neuro: grossly normal, moves all extremities  Assessment/Plan: 1. Acute bronchitis, unspecified organism Patient's symptoms most consistent with bronchitis. Discussed likely viral nature and less likely bacterial cause. Benign lung exam and stable vital signs make pneumonia unlikely. Discussed possible treatment options and with lungs CTA at this time, will hold off on prednisone taper. Also provided with a prescription for doxycycline to take if not improved in the next 2-3 days with duration of symptoms. Advised that if his symptoms were to worsen he should let us know. He was provided with cough suppressant as well. He is given return precautions.  - doxycycline (VIBRA-TABS) 100 MG tablet; Take 1 tablet (100 mg total) by mouth 2 (two) times daily.  Dispense: 20 tablet; Refill: 0 - promethazine-dextromethorphan (PROMETHAZINE-DM) 6.25-15 MG/5ML syrup; Take 5 mLs by mouth 4 (four) times daily as needed for cough.  Dispense: 118 mL; Refill: 0  2. Acute otitis externa of right ear, unspecified type Exam and history are most consistent with otitis externa. No history of recent trigger but with symptoms present, opted to treat with otic solution. Provided close return precautions.  - ofloxacin (FLOXIN) 0.3 % OTIC solution; Place 10 drops into the right ear 2 (two) times daily.  Dispense: 10 mL; Refill: 0    Finally, we reviewed reasons to return to care including if symptoms worsen or persist or new concerns arise- once again particularly shortness of breath or fever.   Laurita Quint, FNP

## 2018-08-01 ENCOUNTER — Ambulatory Visit: Payer: BLUE CROSS/BLUE SHIELD | Admitting: Physician Assistant

## 2018-08-01 ENCOUNTER — Encounter: Payer: Self-pay | Admitting: Physician Assistant

## 2018-08-01 ENCOUNTER — Other Ambulatory Visit: Payer: Self-pay

## 2018-08-01 VITALS — BP 108/62 | HR 54 | Temp 97.9°F | Resp 14 | Ht 71.0 in | Wt 184.0 lb

## 2018-08-01 DIAGNOSIS — H6502 Acute serous otitis media, left ear: Secondary | ICD-10-CM | POA: Diagnosis not present

## 2018-08-01 DIAGNOSIS — H66001 Acute suppurative otitis media without spontaneous rupture of ear drum, right ear: Secondary | ICD-10-CM | POA: Diagnosis not present

## 2018-08-01 MED ORDER — AZITHROMYCIN 250 MG PO TABS: ORAL_TABLET | ORAL | 0 refills | Status: DC

## 2018-08-01 NOTE — Patient Instructions (Signed)
Please keep well-hydrated and get plenty of rest.  Start saline nasal rinses as directed. Start OTC Claritin-D to help with ear pressure and fluid behind the ear drums. Take the antibiotic as directed.  Follow-up with Dr. Alain Marion if symptoms are not resolving with this treatment.    Otitis Media, Adult Otitis media is redness, soreness, and puffiness (swelling) in the space just behind your eardrum (middle ear). It may be caused by allergies or infection. It often happens along with a cold. Follow these instructions at home:  Take your medicine as told. Finish it even if you start to feel better.  Only take over-the-counter or prescription medicines for pain, discomfort, or fever as told by your doctor.  Follow up with your doctor as told. Contact a doctor if:  You have otitis media only in one ear, or bleeding from your nose, or both.  You notice a lump on your neck.  You are not getting better in 3-5 days.  You feel worse instead of better. Get help right away if:  You have pain that is not helped with medicine.  You have puffiness, redness, or pain around your ear.  You get a stiff neck.  You cannot move part of your face (paralysis).  You notice that the bone behind your ear hurts when you touch it. This information is not intended to replace advice given to you by your health care provider. Make sure you discuss any questions you have with your health care provider. Document Released: 02/01/2008 Document Revised: 01/21/2016 Document Reviewed: 03/12/2013 Elsevier Interactive Patient Education  2017 [employer].

## 2018-08-01 NOTE — Progress Notes (Signed)
Patient presents to clinic today c/o 1 day of R ear pain and pressure. Notes some mild pressure/popping of L ear. Was seen a few weeks ago by PCP and diagnosed/treated for acute bacterial bronchitis and R otitis externa with combination of Doxycycline and Ofloxacin otic. Notes resolution of symptoms with this regimen before new symptoms began yesterday evening. Denies fever, chills, sinus pain or tooth pain.   Past Medical History:  Diagnosis Date  . Asthma   . Asthma   . Rectal bleeding   . Rectal pain     No current outpatient medications on file prior to visit.   No current facility-administered medications on file prior to visit.     Allergies  Allergen Reactions  . Fluticasone-Salmeterol Anaphylaxis  . Cefaclor     unknown  . Diphenhydramine Hcl Hives  . Penicillins     unknown    Family History  Problem Relation Age of Onset  . Cancer Maternal Grandfather 87       colon ca  . Colon cancer Paternal Uncle    Social History   Socioeconomic History  . Marital status: Married    Spouse name: Not on file  . Number of children: Not on file  . Years of education: Not on file  . Highest education level: Not on file  Occupational History  . Not on file  Social Needs  . Financial resource strain: Not on file  . Food insecurity:    Worry: Not on file    Inability: Not on file  . Transportation needs:    Medical: Not on file    Non-medical: Not on file  Tobacco Use  . Smoking status: Never Smoker  . Smokeless tobacco: Never Used  Substance and Sexual Activity  . Alcohol use: No  . Drug use: No  . Sexual activity: Yes  Lifestyle  . Physical activity:    Days per week: Not on file    Minutes per session: Not on file  . Stress: Not on file  Relationships  . Social connections:    Talks on phone: Not on file    Gets together: Not on file    Attends religious service: Not on file    Active member of club or organization: Not on file    Attends meetings of  clubs or organizations: Not on file    Relationship status: Not on file  Other Topics Concern  . Not on file  Social History Narrative  . Not on file    Review of Systems - See HPI.  All other ROS are negative.  BP 108/62   Pulse (!) 54   Temp 97.9 F (36.6 C) (Oral)   Resp 14   Ht 5\' 11"  (1.803 m)   Wt 184 lb (83.5 kg)   SpO2 99%   BMI 25.66 kg/m   Physical Exam  Constitutional: He is oriented to person, place, and time. He appears well-developed and well-nourished.  HENT:  Head: Normocephalic and atraumatic.  Right Ear: Ear canal normal. Tympanic membrane is erythematous and bulging. A middle ear effusion (suppurative) is present.  Left Ear: Ear canal normal. Tympanic membrane is not erythematous and not bulging. A middle ear effusion (serous) is present.  Eyes: Pupils are equal, round, and reactive to light.  Neck: Neck supple.  Cardiovascular: Normal rate, regular rhythm, normal heart sounds and intact distal pulses.  Pulmonary/Chest: Effort normal and breath sounds normal. No stridor. No respiratory distress. He has no wheezes. He has  no rales. He exhibits no tenderness.  Lymphadenopathy:    He has no cervical adenopathy.  Neurological: He is alert and oriented to person, place, and time.  Vitals reviewed.  Assessment/Plan: 1. Non-recurrent acute suppurative otitis media of right ear without spontaneous rupture of tympanic membrane Penicillin-allergic. Will start Zithromax. Patient to start saline nasal rinses. Tylenol for pain. Follow-up if not resolving. - azithromycin (ZITHROMAX) 250 MG tablet; Take 2 tablets on Day 1. Then take 1 tablet daily.  Dispense: 6 tablet; Refill: 0  2. Non-recurrent acute serous otitis media of left ear Start Claritin-D to help dry things out. DAily saline nasal rinse and insufflation of TM reviewed. Follow-up if not resolving.   Leeanne Rio, PA-C

## 2018-09-18 ENCOUNTER — Ambulatory Visit: Payer: Self-pay | Admitting: *Deleted

## 2018-09-18 MED ORDER — OSELTAMIVIR PHOSPHATE 75 MG PO CAPS: 75.0000 mg | ORAL_CAPSULE | Freq: Every day | ORAL | 0 refills | Status: DC

## 2018-09-18 NOTE — Telephone Encounter (Signed)
Tamiflu Rx emailed Thx

## 2018-09-18 NOTE — Telephone Encounter (Signed)
Please advise about Tamiflu

## 2018-09-18 NOTE — Telephone Encounter (Signed)
Pt calling stating that his wife was diagnosed with the flu on today. Pt denies any symptoms at this time. Pt states he has a history of asthma but it has been years since he was treated.Pt given home care advice to avoid the spread of the flu. Pt verbalized understanding. If prescription for Tamilflu is called in pt would like it to be sent to [facility] on [address].   Reason for Disposition . [1]Influenza EXPOSURE  (Close Contact) within last 7 days AND [2] NO respiratory symptoms  Answer Assessment - Initial Assessment Questions 1. TYPE of EXPOSURE: "How were you exposed?" (e.g., close contact, not a close contact)     Pt's wife 2. DATE of EXPOSURE: "When did the exposure occur?" (e.g., hour, days, weeks)     today     3. HIGH RISK for COMPLICATIONS: "Do you have any heart or lung problems? Do you have a weakened immune system?" (e.g., CHF, COPD, asthma, HIV positive, chemotherapy, renal failure, diabetes mellitus, sickle cell anemia)     History of asthma, but states he has not been treated for it in years 5. SYMPTOMS: "Do you have any symptoms?" (e.g., cough, fever, sore throat, difficulty breathing).     No  Protocols used: INFLUENZA EXPOSURE-A-AH

## 2018-09-18 NOTE — Addendum Note (Signed)
Addended by: Cassandria Anger on: 09/18/2018 05:35 PM   Modules accepted: Orders

## 2019-06-04 ENCOUNTER — Other Ambulatory Visit: Payer: Self-pay

## 2019-06-04 DIAGNOSIS — Z20822 Contact with and (suspected) exposure to covid-19: Secondary | ICD-10-CM

## 2019-06-06 LAB — NOVEL CORONAVIRUS, NAA: SARS-CoV-2, NAA: DETECTED — AB

## 2019-09-04 ENCOUNTER — Ambulatory Visit (INDEPENDENT_AMBULATORY_CARE_PROVIDER_SITE_OTHER): Payer: Commercial Managed Care - PPO | Admitting: Family

## 2019-09-04 DIAGNOSIS — J019 Acute sinusitis, unspecified: Secondary | ICD-10-CM | POA: Diagnosis not present

## 2019-09-04 MED ORDER — DOXYCYCLINE HYCLATE 100 MG PO TABS: 100.0000 mg | ORAL_TABLET | Freq: Two times a day (BID) | ORAL | 0 refills | Status: DC

## 2019-09-04 NOTE — Progress Notes (Signed)
[name] [name] is a 29 y.o. male with the following history as recorded in [employer]:  Patient Active Problem List   Diagnosis Date Noted  . Epididymitis 11/03/2017  . Night sweats 11/03/2017  . Scalp itch 01/24/2017  . Pain of left heel 11/03/2015  . RLQ abdominal pain 06/25/2015  . Arthralgia 06/25/2015  . Right flank pain 06/05/2015  . Urinary hesitancy 06/05/2015  . Neoplasm of uncertain behavior of skin 11/11/2014  . Well adult exam 11/10/2014  . Rectal bleeding 03/15/2012  . Abnormal digital rectal exam 03/15/2012  . Rectal pain 03/15/2012  . INSOMNIA, CHRONIC 01/06/2010  . SYNCOPE 01/06/2010  . HEMATOCHEZIA 12/29/2009  . FATIGUE 12/29/2009  . Epistaxis 12/29/2009  . Asthma 05/24/2007  . ACNE NEC 05/24/2007  . ABDOMINAL PAIN 05/24/2007    Current Outpatient Medications  Medication Sig Dispense Refill  . doxycycline (VIBRA-TABS) 100 MG tablet Take 1 tablet (100 mg total) by mouth 2 (two) times daily. 20 tablet 0   No current facility-administered medications for this visit.    Allergies: Fluticasone-salmeterol, Cefaclor, Diphenhydramine hcl, and Penicillins  Past Medical History:  Diagnosis Date  . Asthma   . Asthma   . Rectal bleeding   . Rectal pain     Past Surgical History:  Procedure Laterality Date  . ADENOIDECTOMY    . ANKLE SURGERY    . FLEXIBLE SIGMOIDOSCOPY  03/16/2012   Procedure: FLEXIBLE SIGMOIDOSCOPY;  Surgeon: Rogene Houston, MD;  Location: [facility];  Service: Endoscopy;  Laterality: N/A;  1215    Family History  Problem Relation Age of Onset  . Cancer Maternal Grandfather 2       colon ca  . Colon cancer Paternal Uncle     Social History   Tobacco Use  . Smoking status: Never Smoker  . Smokeless tobacco: Never Used  Substance Use Topics  . Alcohol use: No    Subjective:    I connected with [name] [name] on 09/04/19 at 11:20 AM EST by a video enabled telemedicine application and verified that I am speaking with the  correct person using two identifiers. Provider in office/ patient is at home; provider and patient are only 2 people on video call.     I discussed the limitations of evaluation and management by telemedicine and the availability of in person appointments. The patient expressed understanding and agreed to proceed.  Notes he has been having sore throat "on and off" for the past 2 months; seems that his symptoms have worsened in the past 3 weeks; + PND/ feels that his right ear is congested; did have COVID in October; no chest pain or shortness of breath; no fever;     Objective:  There were no vitals filed for this visit.  General: Well developed, well nourished, in no acute distress  Head: Normocephalic and atraumatic  Lungs: Respirations unlabored;  Neurologic: Alert and oriented; speech intact; face symmetrical;  Assessment:  1. Acute sinusitis, recurrence not specified, unspecified location     Plan:  Will treat for possible sinus infection; Rx for Doxycycline 100 mg bid x 10 days and encouraged to use Claritin D for congestion; discussed that with his type of symptoms and length of time he has had symptoms, he will most likely need to be seen in the office if symptoms persist.   No follow-ups on file.  No orders of the defined types were placed in this encounter.   Requested Prescriptions   Signed Prescriptions Disp Refills  .  doxycycline (VIBRA-TABS) 100 MG tablet 20 tablet 0    Sig: Take 1 tablet (100 mg total) by mouth 2 (two) times daily.

## 2020-07-15 NOTE — Progress Notes (Signed)
Subjective:    Patient ID: [name] [name], male    DOB: 02-11-1991, 29 y.o.   MRN: [id number]  HPI The patient is here for an acute visit.   Knot on arm, numbness in arm - he has a small knot in his anterior right forearm that has been there for several years.  He thinks it has gotten bigger recently.  He thinks it is what is causing the numbness in his right fingers-he states is just the fingertips of fingers 2-5 and it is a numbness/tingling.  He thinks he had there might be a little weakness in the hand at times.  He also states discomfort distally from the knot in his forearm toward his hand.  He denies any true pain.  He mentioned that he is seeing a chiropractor for his neck.  He is only had one treatment.  He originally went to the chiropractor for his sciatica, but he had x-rays of his neck and was told that there is no unevenness and he needed some adjustments.  He denies any neck pain.  He has some right shoulder symptoms, but that is from exercising.  He denies any upper arm pain or numbness/tingling.  He is here because he wanted to have the knot in his arm removed because he felt that was the cause of the finger numbness/tingling.  Medications and allergies reviewed with patient and updated if appropriate.  Patient Active Problem List   Diagnosis Date Noted  . Epididymitis 11/03/2017  . Night sweats 11/03/2017  . Scalp itch 01/24/2017  . Pain of left heel 11/03/2015  . RLQ abdominal pain 06/25/2015  . Arthralgia 06/25/2015  . Right flank pain 06/05/2015  . Urinary hesitancy 06/05/2015  . Neoplasm of uncertain behavior of skin 11/11/2014  . Well adult exam 11/10/2014  . Rectal bleeding 03/15/2012  . Abnormal digital rectal exam 03/15/2012  . Rectal pain 03/15/2012  . INSOMNIA, CHRONIC 01/06/2010  . SYNCOPE 01/06/2010  . HEMATOCHEZIA 12/29/2009  . FATIGUE 12/29/2009  . Epistaxis 12/29/2009  . Asthma 05/24/2007  . ACNE NEC 05/24/2007  . ABDOMINAL PAIN 05/24/2007     No current outpatient medications on file prior to visit.   No current facility-administered medications on file prior to visit.    Past Medical History:  Diagnosis Date  . Asthma   . Asthma   . Rectal bleeding   . Rectal pain     Past Surgical History:  Procedure Laterality Date  . ADENOIDECTOMY    . ANKLE SURGERY    . FLEXIBLE SIGMOIDOSCOPY  03/16/2012   Procedure: FLEXIBLE SIGMOIDOSCOPY;  Surgeon: Rogene Houston, MD;  Location: [facility];  Service: Endoscopy;  Laterality: N/A;  1215    Social History   Socioeconomic History  . Marital status: Married    Spouse name: Not on file  . Number of children: Not on file  . Years of education: Not on file  . Highest education level: Not on file  Occupational History  . Not on file  Tobacco Use  . Smoking status: Never Smoker  . Smokeless tobacco: Never Used  Substance and Sexual Activity  . Alcohol use: No  . Drug use: No  . Sexual activity: Yes  Other Topics Concern  . Not on file  Social History Narrative  . Not on file   Social Determinants of Health   Financial Resource Strain:   . Difficulty of Paying Living Expenses: Not on file  Food Insecurity:   .  Worried About Charity fundraiser in the Last Year: Not on file  . Ran Out of Food in the Last Year: Not on file  Transportation Needs:   . Lack of Transportation (Medical): Not on file  . Lack of Transportation (Non-Medical): Not on file  Physical Activity:   . Days of Exercise per Week: Not on file  . Minutes of Exercise per Session: Not on file  Stress:   . Feeling of Stress : Not on file  Social Connections:   . Frequency of Communication with Friends and Family: Not on file  . Frequency of Social Gatherings with Friends and Family: Not on file  . Attends Religious Services: Not on file  . Active Member of Clubs or Organizations: Not on file  . Attends Archivist Meetings: Not on file  . Marital Status: Not on file    Family  History  Problem Relation Age of Onset  . Cancer Maternal Grandfather 51       colon ca  . Colon cancer Paternal Uncle     Review of Systems     Objective:   Vitals:   07/16/20 0813  BP: 110/74  Pulse: (!) 58  Temp: 98 F (36.7 C)  SpO2: 97%   BP Readings from Last 3 Encounters:  07/16/20 110/74  08/01/18 108/62  07/12/18 124/80   Wt Readings from Last 3 Encounters:  07/16/20 183 lb 9.6 oz (83.3 kg)  08/01/18 184 lb (83.5 kg)  07/12/18 181 lb 12.8 oz (82.5 kg)   Body mass index is 25.61 kg/m.   Physical Exam Constitutional:      General: He is not in acute distress.    Appearance: Normal appearance. He is not ill-appearing.  HENT:     Head: Normocephalic and atraumatic.  Musculoskeletal:        General: No swelling or tenderness (Entire right arm nontender.  Right upper back and neck nontender.). Normal range of motion.  Skin:    General: Skin is warm and dry.     Comments: Allmond size palpable superficial lump anterior forearm-nontender, mobile-likely lipoma  Neurological:     Mental Status: He is alert.     Comments: Normal sensation and strength right hand and arm            Assessment & Plan:    Right forearm lipoma, right finger numbness/tingling, possible weakness in hand: He feels the lipoma in his right forearm has gotten larger, but I am not sure it is responsible for his symptoms.  Discussed that we can have him see surgery and have it removed but again it may not be responsible for his symptoms For his right finger numbness/tingling and possible hand weakness I recommend seeing orthopedics for further evaluation-possible pinched nerve.  It does not sound cervical in nature and discussed that if he is having no pain he most likely does not need to be adjusted by the chiropractor Referral ordered for Ortho care  He will let us know if he does want to see surgery for the lipoma removal   This visit occurred during the SARS-CoV-2 public health  emergency.  Safety protocols were in place, including screening questions prior to the visit, additional usage of staff PPE, and extensive cleaning of exam room while observing appropriate contact time as indicated for disinfecting solutions.

## 2020-07-16 ENCOUNTER — Ambulatory Visit (INDEPENDENT_AMBULATORY_CARE_PROVIDER_SITE_OTHER): Payer: Commercial Managed Care - PPO | Admitting: Internal Medicine

## 2020-07-16 ENCOUNTER — Encounter: Payer: Self-pay | Admitting: Internal Medicine

## 2020-07-16 ENCOUNTER — Other Ambulatory Visit: Payer: Self-pay

## 2020-07-16 VITALS — BP 110/74 | HR 58 | Temp 98.0°F | Ht 71.0 in | Wt 183.6 lb

## 2020-07-16 DIAGNOSIS — R200 Anesthesia of skin: Secondary | ICD-10-CM

## 2020-07-16 DIAGNOSIS — D1721 Benign lipomatous neoplasm of skin and subcutaneous tissue of right arm: Secondary | ICD-10-CM | POA: Diagnosis not present

## 2020-07-16 NOTE — Patient Instructions (Signed)
   A referral was ordered for Ortho Care.       Someone from their office will call you to schedule an appointment.      

## 2020-09-04 ENCOUNTER — Other Ambulatory Visit: Payer: Self-pay | Admitting: Otolaryngology

## 2020-09-04 ENCOUNTER — Other Ambulatory Visit (HOSPITAL_COMMUNITY): Payer: Self-pay | Admitting: Otolaryngology

## 2020-09-04 DIAGNOSIS — R070 Pain in throat: Secondary | ICD-10-CM

## 2020-09-04 DIAGNOSIS — M2420 Disorder of ligament, unspecified site: Secondary | ICD-10-CM

## 2021-08-17 ENCOUNTER — Encounter: Payer: Self-pay | Admitting: Internal Medicine

## 2021-08-17 ENCOUNTER — Ambulatory Visit (INDEPENDENT_AMBULATORY_CARE_PROVIDER_SITE_OTHER): Payer: Commercial Managed Care - PPO | Admitting: Internal Medicine

## 2021-08-17 ENCOUNTER — Other Ambulatory Visit: Payer: Self-pay

## 2021-08-17 VITALS — BP 118/72 | HR 73 | Temp 98.3°F | Ht 71.0 in | Wt 200.8 lb

## 2021-08-17 DIAGNOSIS — B001 Herpesviral vesicular dermatitis: Secondary | ICD-10-CM | POA: Diagnosis not present

## 2021-08-17 DIAGNOSIS — K219 Gastro-esophageal reflux disease without esophagitis: Secondary | ICD-10-CM | POA: Diagnosis not present

## 2021-08-17 DIAGNOSIS — Z0000 Encounter for general adult medical examination without abnormal findings: Secondary | ICD-10-CM | POA: Diagnosis not present

## 2021-08-17 DIAGNOSIS — H9201 Otalgia, right ear: Secondary | ICD-10-CM | POA: Diagnosis not present

## 2021-08-17 LAB — CBC WITH DIFFERENTIAL/PLATELET
Basophils Absolute: 0 10*3/uL (ref 0.0–0.1)
Basophils Relative: 0.4 % (ref 0.0–3.0)
Eosinophils Absolute: 0.2 10*3/uL (ref 0.0–0.7)
Eosinophils Relative: 3.6 % (ref 0.0–5.0)
HCT: 44.2 % (ref 39.0–52.0)
Hemoglobin: 15 g/dL (ref 13.0–17.0)
Lymphocytes Relative: 35.1 % (ref 12.0–46.0)
Lymphs Abs: 1.9 10*3/uL (ref 0.7–4.0)
MCHC: 33.9 g/dL (ref 30.0–36.0)
MCV: 89.9 fl (ref 78.0–100.0)
Monocytes Absolute: 0.4 10*3/uL (ref 0.1–1.0)
Monocytes Relative: 7 % (ref 3.0–12.0)
Neutro Abs: 2.9 10*3/uL (ref 1.4–7.7)
Neutrophils Relative %: 53.9 % (ref 43.0–77.0)
Platelets: 167 10*3/uL (ref 150.0–400.0)
RBC: 4.92 Mil/uL (ref 4.22–5.81)
RDW: 12.8 % (ref 11.5–15.5)
WBC: 5.4 10*3/uL (ref 4.0–10.5)

## 2021-08-17 LAB — URINALYSIS
Bilirubin Urine: NEGATIVE
Hgb urine dipstick: NEGATIVE
Ketones, ur: NEGATIVE
Leukocytes,Ua: NEGATIVE
Nitrite: NEGATIVE
Specific Gravity, Urine: >=1.03 — AB (ref 1.000–1.030)
Total Protein, Urine: NEGATIVE
Urine Glucose: NEGATIVE
Urobilinogen, UA: 0.2 (ref 0.0–1.0)
pH: 5.5 (ref 5.0–8.0)

## 2021-08-17 LAB — COMPREHENSIVE METABOLIC PANEL
ALT: 36 U/L (ref 0–53)
AST: 24 U/L (ref 0–37)
Albumin: 4.4 g/dL (ref 3.5–5.2)
Alkaline Phosphatase: 43 U/L (ref 39–117)
BUN: 12 mg/dL (ref 6–23)
CO2: 31 mEq/L (ref 19–32)
Calcium: 9.7 mg/dL (ref 8.4–10.5)
Chloride: 103 mEq/L (ref 96–112)
Creatinine, Ser: 1.2 mg/dL (ref 0.40–1.50)
GFR: 80.15 mL/min (ref >60.00)
Glucose, Bld: 89 mg/dL (ref 70–99)
Potassium: 3.9 mEq/L (ref 3.5–5.1)
Sodium: 140 mEq/L (ref 135–145)
Total Bilirubin: 0.4 mg/dL (ref 0.2–1.2)
Total Protein: 6.9 g/dL (ref 6.0–8.3)

## 2021-08-17 LAB — H. PYLORI ANTIBODY, IGG: H Pylori IgG: NEGATIVE

## 2021-08-17 LAB — TSH: TSH: 1.47 u[IU]/mL (ref 0.35–5.50)

## 2021-08-17 MED ORDER — VITAMIN D3 50 MCG (2000 UT) PO CAPS: 2000.0000 [IU] | ORAL_CAPSULE | Freq: Every day | ORAL | 3 refills | Status: DC

## 2021-08-17 MED ORDER — FAMOTIDINE 40 MG PO TABS: 40.0000 mg | ORAL_TABLET | Freq: Every day | ORAL | 3 refills | Status: DC

## 2021-08-17 MED ORDER — VALACYCLOVIR HCL 500 MG PO TABS: ORAL_TABLET | ORAL | 2 refills | Status: DC

## 2021-08-17 NOTE — Patient Instructions (Addendum)
Gastroesophageal Reflux Disease, Adult Gastroesophageal reflux (GER) happens when acid from the stomach flows up into the tube that connects the mouth and the stomach (esophagus). Normally, food travels down the esophagus and stays in the stomach to be digested. However, when a person has GER, food and stomach acid sometimes move back up into the esophagus. If this becomes a more serious problem, the person may be diagnosed with a disease called gastroesophageal reflux disease (GERD). GERD occurs when the reflux: Happens often. Causes frequent or severe symptoms. Causes problems such as damage to the esophagus. When stomach acid comes in contact with the esophagus, the acid may cause inflammation in the esophagus. Over time, GERD may create small holes (ulcers) in the lining of the esophagus. What are the causes? This condition is caused by a problem with the muscle between the esophagus and the stomach (lower esophageal sphincter, or LES). Normally, the LES muscle closes after food passes through the esophagus to the stomach. When the LES is weakened or abnormal, it does not close properly, and that allows food and stomach acid to go back up into the esophagus. The LES can be weakened by certain dietary substances, medicines, and medical conditions, including: Tobacco use. Pregnancy. Having a hiatal hernia. Alcohol use. Certain foods and beverages, such as coffee, chocolate, onions, and peppermint. What increases the risk? You are more likely to develop this condition if you: Have an increased body weight. Have a connective tissue disorder. Take NSAIDs, such as ibuprofen. What are the signs or symptoms? Symptoms of this condition include: Heartburn. Difficult or painful swallowing and the feeling of having a lump in the throat. A bitter taste in the mouth. Bad breath and having a large amount of saliva. Having an upset or bloated stomach and belching. Chest pain. Different conditions  can cause chest pain. Make sure you see your health care provider if you experience chest pain. Shortness of breath or wheezing. Ongoing (chronic) cough or a nighttime cough. Wearing away of tooth enamel. Weight loss. How is this diagnosed? This condition may be diagnosed based on a medical history and a physical exam. To determine if you have mild or severe GERD, your health care provider may also monitor how you respond to treatment. You may also have tests, including: A test to examine your stomach and esophagus with a small camera (endoscopy). A test that measures the acidity level in your esophagus. A test that measures how much pressure is on your esophagus. A barium swallow or modified barium swallow test to show the shape, size, and functioning of your esophagus. How is this treated? Treatment for this condition may vary depending on how severe your symptoms are. Your health care provider may recommend: Changes to your diet. Medicine. Surgery. The goal of treatment is to help relieve your symptoms and to prevent complications. Follow these instructions at home: Eating and drinking  Follow a diet as recommended by your health care provider. This may involve avoiding foods and drinks such as: Coffee and tea, with or without caffeine. Drinks that contain alcohol. Energy drinks and sports drinks. Carbonated drinks or sodas. Chocolate and cocoa. Peppermint and mint flavorings. Garlic and onions. Horseradish. Spicy and acidic foods, including peppers, chili powder, curry powder, vinegar, hot sauces, and barbecue sauce. Citrus fruit juices and citrus fruits, such as oranges, lemons, and limes. Tomato-based foods, such as red sauce, chili, salsa, and pizza with red sauce. Fried and fatty foods, such as donuts, french fries, potato chips, and  high-fat dressings. High-fat meats, such as hot dogs and fatty cuts of red and white meats, such as rib eye steak, sausage, ham, and  bacon. High-fat dairy items, such as whole milk, butter, and cream cheese. Eat small, frequent meals instead of large meals. Avoid drinking large amounts of liquid with your meals. Avoid eating meals during the 2-3 hours before bedtime. Avoid lying down right after you eat. Do not exercise right after you eat. Lifestyle  Do not use any products that contain nicotine or tobacco. These products include cigarettes, chewing tobacco, and vaping devices, such as e-cigarettes. If you need help quitting, ask your health care provider. Try to reduce your stress by using methods such as yoga or meditation. If you need help reducing stress, ask your health care provider. If you are overweight, reduce your weight to an amount that is healthy for you. Ask your health care provider for guidance about a safe weight loss goal. General instructions Pay attention to any changes in your symptoms. Take over-the-counter and prescription medicines only as told by your health care provider. Do not take aspirin, ibuprofen, or other NSAIDs unless your health care provider told you to take these medicines. Wear loose-fitting clothing. Do not wear anything tight around your waist that causes pressure on your abdomen. Raise (elevate) the head of your bed about 6 inches (15 cm). You can use a wedge to do this. Avoid bending over if this makes your symptoms worse. Keep all follow-up visits. This is important. Contact a health care provider if: You have: New symptoms. Unexplained weight loss. Difficulty swallowing or it hurts to swallow. Wheezing or a persistent cough. A hoarse voice. Your symptoms do not improve with treatment. Get help right away if: You have sudden pain in your arms, neck, jaw, teeth, or back. You suddenly feel sweaty, dizzy, or light-headed. You have chest pain or shortness of breath. You vomit and the vomit is green, yellow, or black, or it looks like blood or coffee grounds. You faint. You  have stool that is red, bloody, or black. You cannot swallow, drink, or eat. These symptoms may represent a serious problem that is an emergency. Do not wait to see if the symptoms will go away. Get medical help right away. Call your local emergency services (911 in the [place] Do not drive yourself to the hospital. Summary Gastroesophageal reflux happens when acid from the stomach flows up into the esophagus. GERD is a disease in which the reflux happens often, causes frequent or severe symptoms, or causes problems such as damage to the esophagus. Treatment for this condition may vary depending on how severe your symptoms are. Your health care provider may recommend diet and lifestyle changes, medicine, or surgery. Contact a health care provider if you have new or worsening symptoms. Take over-the-counter and prescription medicines only as told by your health care provider. Do not take aspirin, ibuprofen, or other NSAIDs unless your health care provider told you to do so. Keep all follow-up visits as told by your health care provider. This is important. This information is not intended to replace advice given to you by your health care provider. Make sure you discuss any questions you have with your health care provider. Document Revised: 02/24/2020 Document Reviewed: 02/24/2020 Elsevier Patient Education  2022 [employer].   The Obesity Code book by Sharman Cheek   These suggestions will probably help you to improve your metabolism if you are not overweight and to lose weight if you are  overweight: 1.  Reduce your consumption of sugars and starches.  Eliminate high fructose corn syrup from your diet.  Reduce your consumption of processed foods.  For desserts try to have seasonal fruits, berries, nuts, cheeses or dark chocolate with more than 70% cacao. 2.  Do not snack 3.  You do not have to eat breakfast.  If you choose to have breakfast - eat plain greek yogurt, eggs, oatmeal (without sugar) -  use honey if you need to. 4.  Drink water, freshly brewed unsweetened tea (green, black or herbal) or coffee.  Do not drink sodas including diet sodas , juices, beverages sweetened with artificial sweeteners. 5.  Reduce your consumption of refined grains. 6.  Avoid protein drinks such as Optifast, Slim fast etc. Eat chicken, fish, meat, dairy and beans for your sources of protein. 7.  Natural unprocessed fats like cold pressed virgin olive oil, butter, coconut oil are good for you.  Eat avocados. 8.  Increase your consumption of fiber.  Fruits, berries, vegetables, whole grains, flaxseed, chia seeds, beans, popcorn, nuts, oatmeal are good sources of fiber 9.  Use vinegar in your diet, i.e. apple cider vinegar, red wine or balsamic vinegar 10.  You can try fasting.  For example you can skip breakfast and lunch every other day (24-hour fast) 11.  Stress reduction, good night sleep, relaxation, meditation, yoga and other physical activity is likely to help you to maintain low weight too. 12.  If you drink alcohol, limit your alcohol intake to no more than 2 drinks a day.

## 2021-08-17 NOTE — Assessment & Plan Note (Signed)
Recurrent Valtrex 500 mg prn

## 2021-08-17 NOTE — Assessment & Plan Note (Signed)
Pepcid Check H pylori

## 2021-08-17 NOTE — Progress Notes (Signed)
Subjective:  Patient ID: [name] [name], male    DOB: 31-Dec-1990  Age: 30 y.o. MRN: [id number]  CC: Annual Exam   HPI PAWAN KNECHTEL presents for well exam C/o R ear pain x 12 mo C/o GERD  No outpatient medications prior to visit.   No facility-administered medications prior to visit.    ROS: Review of Systems  Constitutional:  Positive for unexpected weight change. Negative for appetite change and fatigue.  HENT:  Positive for ear pain. Negative for congestion, nosebleeds, sneezing, sore throat and trouble swallowing.   Eyes:  Negative for itching and visual disturbance.  Respiratory:  Negative for cough.   Cardiovascular:  Negative for chest pain, palpitations and leg swelling.  Gastrointestinal:  Negative for abdominal distention, blood in stool, diarrhea and nausea.  Genitourinary:  Negative for frequency and hematuria.  Musculoskeletal:  Negative for back pain, gait problem, joint swelling and neck pain.  Skin:  Negative for rash.  Neurological:  Negative for dizziness, tremors, speech difficulty and weakness.  Psychiatric/Behavioral:  Negative for agitation, dysphoric mood and sleep disturbance. The patient is not nervous/anxious.    Objective:  BP 118/72 (BP Location: Left Arm)    Pulse 73    Temp 98.3 F (36.8 C) (Oral)    Ht 5\' 11"  (1.803 m)    Wt 200 lb 12.8 oz (91.1 kg)    SpO2 96%    BMI 28.01 kg/m   BP Readings from Last 3 Encounters:  08/17/21 118/72  07/16/20 110/74  08/01/18 108/62    Wt Readings from Last 3 Encounters:  08/17/21 200 lb 12.8 oz (91.1 kg)  07/16/20 183 lb 9.6 oz (83.3 kg)  08/01/18 184 lb (83.5 kg)    Physical Exam Constitutional:      General: He is not in acute distress.    Appearance: He is well-developed.     Comments: NAD  HENT:     Head: Atraumatic.     Right Ear: Tympanic membrane, ear canal and external ear normal.     Left Ear: Tympanic membrane, ear canal and external ear normal.  Eyes:     Conjunctiva/sclera:  Conjunctivae normal.     Pupils: Pupils are equal, round, and reactive to light.  Neck:     Thyroid: No thyromegaly.     Vascular: No JVD.  Cardiovascular:     Rate and Rhythm: Normal rate and regular rhythm.     Heart sounds: Normal heart sounds. No murmur heard.   No friction rub. No gallop.  Pulmonary:     Effort: Pulmonary effort is normal. No respiratory distress.     Breath sounds: Normal breath sounds. No wheezing or rales.  Chest:     Chest wall: No tenderness.  Abdominal:     General: Bowel sounds are normal. There is no distension.     Palpations: Abdomen is soft. There is no mass.     Tenderness: There is no abdominal tenderness. There is no guarding or rebound.  Musculoskeletal:        General: No tenderness. Normal range of motion.     Cervical back: Normal range of motion.  Lymphadenopathy:     Cervical: No cervical adenopathy.  Skin:    General: Skin is warm and dry.     Findings: No rash.  Neurological:     Mental Status: He is alert and oriented to person, place, and time.     Cranial Nerves: No cranial nerve deficit.     Motor:  No abnormal muscle tone.     Coordination: Coordination normal.     Gait: Gait normal.     Deep Tendon Reflexes: Reflexes are normal and symmetric.  Psychiatric:        Behavior: Behavior normal.        Thought Content: Thought content normal.        Judgment: Judgment normal.  Test test-self exam  Lab Results  Component Value Date   WBC 5.4 08/17/2021   HGB 15.0 08/17/2021   HCT 44.2 08/17/2021   PLT 167.0 08/17/2021   GLUCOSE 89 08/17/2021   CHOL 132 11/10/2014   TRIG 120.0 11/10/2014   HDL 48.70 11/10/2014   LDLCALC 59 11/10/2014   ALT 36 08/17/2021   AST 24 08/17/2021   NA 140 08/17/2021   K 3.9 08/17/2021   CL 103 08/17/2021   CREATININE 1.20 08/17/2021   BUN 12 08/17/2021   CO2 31 08/17/2021   TSH 1.47 08/17/2021   INR 1.07 01/21/2010    US SCROTUM W/DOPPLER  Result Date: 11/29/2017 CLINICAL DATA:  Right  testicular pain. EXAM: SCROTAL ULTRASOUND DOPPLER ULTRASOUND OF THE TESTICLES TECHNIQUE: Complete ultrasound examination of the testicles, epididymis, and other scrotal structures was performed. Color and spectral Doppler ultrasound were also utilized to evaluate blood flow to the testicles. COMPARISON:  None. FINDINGS: Right testicle Measurements: 4.3 x 2.4 x 2.7 cm. No mass or microlithiasis visualized. Incidental note made of a right appendix testis. Left testicle Measurements: 4.4 x 2.2 x 2.4 cm. No mass or microlithiasis visualized. Right epididymis:  Normal in size and appearance. Left epididymis:  Normal in size and appearance. Hydrocele:  None visualized. Varicocele:  None visualized. Pulsed Doppler interrogation of both testes demonstrates normal low resistance arterial and venous waveforms bilaterally. IMPRESSION: Normal testicular ultrasound. Electronically Signed   By: Kathreen Devoid   On: 11/29/2017 09:06    Assessment & Plan:   Problem List Items Addressed This Visit     Cold sore    Recurrent Valtrex 500 mg prn      Relevant Medications   valACYclovir (VALTREX) 500 MG tablet   Ear pain    Normal exam.  Ear pain could be related to recurrent canker sores on the right side of the mouth.  We will treat empirically with Valtrex      GERD (gastroesophageal reflux disease)    Pepcid Check H pylori      Relevant Medications   famotidine (PEPCID) 40 MG tablet   Other Relevant Orders   H. pylori antibody, IgG (Completed)   Well adult exam - Primary     We discussed age appropriate health related issues, including available/recomended screening tests and vaccinations. Labs were ordered to be later reviewed . All questions were answered. We discussed one or more of the following - seat belt use, use of sunscreen/sun exposure exercise, fall risk reduction, second hand smoke exposure, firearm use and storage, seat belt use, a need for adhering to healthy diet and exercise. Labs were  ordered.  All questions were answered.        Relevant Orders   TSH (Completed)   Urinalysis (Completed)   CBC with Differential/Platelet (Completed)   Comprehensive metabolic panel (Completed)      Meds ordered this encounter  Medications   valACYclovir (VALTREX) 500 MG tablet    Sig: Take 1 po bid x 5 days prn cold sores    Dispense:  40 tablet    Refill:  2   Cholecalciferol (VITAMIN  D3) 50 MCG (2000 UT) capsule    Sig: Take 1 capsule (2,000 Units total) by mouth daily.    Dispense:  100 capsule    Refill:  3   famotidine (PEPCID) 40 MG tablet    Sig: Take 1 tablet (40 mg total) by mouth daily.    Dispense:  90 tablet    Refill:  3      Follow-up: Return in about 1 year (around 08/17/2022) for Wellness Exam.  Walker Kehr, MD

## 2021-08-30 DIAGNOSIS — H9209 Otalgia, unspecified ear: Secondary | ICD-10-CM | POA: Insufficient documentation

## 2021-08-30 NOTE — Assessment & Plan Note (Signed)

## 2021-08-30 NOTE — Assessment & Plan Note (Signed)
Normal exam.  Ear pain could be related to recurrent canker sores on the right side of the mouth.  We will treat empirically with Valtrex

## 2021-10-19 ENCOUNTER — Encounter: Payer: Self-pay | Admitting: Internal Medicine

## 2022-05-26 ENCOUNTER — Ambulatory Visit: Payer: Commercial Managed Care - PPO | Admitting: Internal Medicine

## 2022-05-31 ENCOUNTER — Ambulatory Visit: Payer: Commercial Managed Care - PPO | Admitting: Internal Medicine

## 2022-05-31 ENCOUNTER — Ambulatory Visit (INDEPENDENT_AMBULATORY_CARE_PROVIDER_SITE_OTHER): Payer: Commercial Managed Care - PPO | Admitting: Internal Medicine

## 2022-05-31 ENCOUNTER — Encounter: Payer: Self-pay | Admitting: Internal Medicine

## 2022-05-31 VITALS — BP 112/78 | HR 61 | Temp 98.1°F | Ht 71.0 in | Wt 199.0 lb

## 2022-05-31 DIAGNOSIS — M79601 Pain in right arm: Secondary | ICD-10-CM | POA: Diagnosis not present

## 2022-05-31 DIAGNOSIS — D229 Melanocytic nevi, unspecified: Secondary | ICD-10-CM | POA: Insufficient documentation

## 2022-05-31 DIAGNOSIS — Z0000 Encounter for general adult medical examination without abnormal findings: Secondary | ICD-10-CM

## 2022-05-31 DIAGNOSIS — M533 Sacrococcygeal disorders, not elsewhere classified: Secondary | ICD-10-CM

## 2022-05-31 DIAGNOSIS — L659 Nonscarring hair loss, unspecified: Secondary | ICD-10-CM

## 2022-05-31 MED ORDER — FINASTERIDE 5 MG PO TABS: ORAL_TABLET | ORAL | 3 refills | Status: DC

## 2022-05-31 MED ORDER — MELOXICAM 15 MG PO TABS: 15.0000 mg | ORAL_TABLET | Freq: Every day | ORAL | 0 refills | Status: DC

## 2022-05-31 NOTE — Assessment & Plan Note (Signed)
Contusion X ray Coccyx pillow Meloxicam 15 mg qd RTC if worse

## 2022-05-31 NOTE — Assessment & Plan Note (Signed)
Start Finasteride 1/4 tab a day

## 2022-05-31 NOTE — Progress Notes (Signed)
Subjective:  Patient ID: AYRTON MCVAY, male    DOB: October 18, 1990  Age: 31 y.o. MRN: [id number]  CC: Tailbone Pain ((R) side, pt said he fell month so ago playing basketball) and Arm Pain (Right, concern about knot )   HPI ALDOUS HOUSEL presents for moles, coccyx pain, R arm pain and moles. Well exam  Outpatient Medications Prior to Visit  Medication Sig Dispense Refill   Cholecalciferol (VITAMIN D3) 50 MCG (2000 UT) capsule Take 1 capsule (2,000 Units total) by mouth daily. 100 capsule 3   famotidine (PEPCID) 40 MG tablet Take 1 tablet (40 mg total) by mouth daily. 90 tablet 3   valACYclovir (VALTREX) 500 MG tablet Take 1 po bid x 5 days prn cold sores 40 tablet 2   No facility-administered medications prior to visit.    ROS: Review of Systems  Constitutional:  Negative for appetite change, fatigue and unexpected weight change.  HENT:  Negative for congestion, nosebleeds, sneezing, sore throat and trouble swallowing.   Eyes:  Negative for itching and visual disturbance.  Respiratory:  Negative for cough.   Cardiovascular:  Negative for chest pain, palpitations and leg swelling.  Gastrointestinal:  Negative for abdominal distention, blood in stool, diarrhea and nausea.  Genitourinary:  Negative for frequency and hematuria.  Musculoskeletal:  Positive for back pain. Negative for gait problem, joint swelling and neck pain.  Skin:  Positive for color change. Negative for rash.  Neurological:  Negative for dizziness, tremors, speech difficulty and weakness.  Psychiatric/Behavioral:  Negative for agitation, dysphoric mood and sleep disturbance. The patient is not nervous/anxious.     Objective:  BP 112/78 (BP Location: Left Arm)   Pulse 61   Temp 98.1 F (36.7 C) (Oral)   Ht '5\' 11"'$  (1.803 m)   Wt 199 lb (90.3 kg)   SpO2 97%   BMI 27.75 kg/m   BP Readings from Last 3 Encounters:  05/31/22 112/78  08/17/21 118/72  07/16/20 110/74    Wt Readings from Last 3  Encounters:  05/31/22 199 lb (90.3 kg)  08/17/21 200 lb 12.8 oz (91.1 kg)  07/16/20 183 lb 9.6 oz (83.3 kg)    Physical Exam Constitutional:      General: He is not in acute distress.    Appearance: Normal appearance. He is well-developed.     Comments: NAD  Eyes:     Conjunctiva/sclera: Conjunctivae normal.     Pupils: Pupils are equal, round, and reactive to light.  Neck:     Thyroid: No thyromegaly.     Vascular: No JVD.  Cardiovascular:     Rate and Rhythm: Normal rate and regular rhythm.     Heart sounds: Normal heart sounds. No murmur heard.    No friction rub. No gallop.  Pulmonary:     Effort: Pulmonary effort is normal. No respiratory distress.     Breath sounds: Normal breath sounds. No wheezing or rales.  Chest:     Chest wall: No tenderness.  Abdominal:     General: Bowel sounds are normal. There is no distension.     Palpations: Abdomen is soft. There is no mass.     Tenderness: There is no abdominal tenderness. There is no guarding or rebound.  Musculoskeletal:        General: No tenderness. Normal range of motion.     Cervical back: Normal range of motion.  Lymphadenopathy:     Cervical: No cervical adenopathy.  Skin:    General: Skin is  warm and dry.     Findings: No rash.  Neurological:     Mental Status: He is alert and oriented to person, place, and time.     Cranial Nerves: No cranial nerve deficit.     Motor: No abnormal muscle tone.     Coordination: Coordination normal.     Gait: Gait normal.     Deep Tendon Reflexes: Reflexes are normal and symmetric.  Psychiatric:        Behavior: Behavior normal.        Thought Content: Thought content normal.        Judgment: Judgment normal.   Coccyx is tender to palpation Right shoulder is sensitive with range of motion Multiple moles  Lab Results  Component Value Date   WBC 5.4 08/17/2021   HGB 15.0 08/17/2021   HCT 44.2 08/17/2021   PLT 167.0 08/17/2021   GLUCOSE 89 08/17/2021   CHOL 132  11/10/2014   TRIG 120.0 11/10/2014   HDL 48.70 11/10/2014   LDLCALC 59 11/10/2014   ALT 36 08/17/2021   AST 24 08/17/2021   NA 140 08/17/2021   K 3.9 08/17/2021   CL 103 08/17/2021   CREATININE 1.20 08/17/2021   BUN 12 08/17/2021   CO2 31 08/17/2021   TSH 1.47 08/17/2021   INR 1.07 01/21/2010    US SCROTUM W/DOPPLER  Result Date: 11/29/2017 CLINICAL DATA:  Right testicular pain. EXAM: SCROTAL ULTRASOUND DOPPLER ULTRASOUND OF THE TESTICLES TECHNIQUE: Complete ultrasound examination of the testicles, epididymis, and other scrotal structures was performed. Color and spectral Doppler ultrasound were also utilized to evaluate blood flow to the testicles. COMPARISON:  None. FINDINGS: Right testicle Measurements: 4.3 x 2.4 x 2.7 cm. No mass or microlithiasis visualized. Incidental note made of a right appendix testis. Left testicle Measurements: 4.4 x 2.2 x 2.4 cm. No mass or microlithiasis visualized. Right epididymis:  Normal in size and appearance. Left epididymis:  Normal in size and appearance. Hydrocele:  None visualized. Varicocele:  None visualized. Pulsed Doppler interrogation of both testes demonstrates normal low resistance arterial and venous waveforms bilaterally. IMPRESSION: Normal testicular ultrasound. Electronically Signed   By: Kathreen Devoid   On: 11/29/2017 09:06    Assessment & Plan:   Problem List Items Addressed This Visit     Arm pain, diffuse, right    Cyst - R forearm      Relevant Orders   Ambulatory referral to Orthopedic Surgery   Coccyx pain - Primary    Contusion X ray Coccyx pillow Meloxicam 15 mg qd RTC if worse      Relevant Medications   meloxicam (MOBIC) 15 MG tablet   Other Relevant Orders   DG Lumbar Spine 2-3 Views   Hair loss    Start Finasteride 1/4 tab a day      Multiple atypical skin moles    Derm ref      Relevant Orders   Ambulatory referral to Dermatology   Well adult exam   Relevant Orders   TSH   Urinalysis   CBC with  Differential/Platelet   Lipid panel   Comprehensive metabolic panel      Meds ordered this encounter  Medications   finasteride (PROSCAR) 5 MG tablet    Sig: Take 1/4 tab daily    Dispense:  30 tablet    Refill:  3   meloxicam (MOBIC) 15 MG tablet    Sig: Take 1 tablet (15 mg total) by mouth daily.    Dispense:  30 tablet    Refill:  0      Follow-up: No follow-ups on file.  Walker Kehr, MD

## 2022-05-31 NOTE — Assessment & Plan Note (Signed)
Derm ref

## 2022-05-31 NOTE — Assessment & Plan Note (Signed)
Cyst - R forearm

## 2022-06-02 ENCOUNTER — Other Ambulatory Visit: Payer: Self-pay | Admitting: Nurse Practitioner

## 2022-06-02 ENCOUNTER — Ambulatory Visit
Admission: RE | Admit: 2022-06-02 | Discharge: 2022-06-02 | Disposition: A | Discharge status: Home / Self Care | Payer: Commercial Managed Care - PPO | Source: Ambulatory Visit | Attending: Nurse Practitioner | Admitting: Nurse Practitioner

## 2022-06-02 DIAGNOSIS — Z021 Encounter for pre-employment examination: Secondary | ICD-10-CM

## 2022-07-03 ENCOUNTER — Other Ambulatory Visit: Payer: Self-pay | Admitting: Internal Medicine

## 2022-08-02 ENCOUNTER — Other Ambulatory Visit: Payer: Self-pay | Admitting: Internal Medicine

## 2022-10-03 ENCOUNTER — Other Ambulatory Visit: Payer: Self-pay | Admitting: Orthopedic Surgery

## 2023-03-15 ENCOUNTER — Ambulatory Visit: Payer: 59 | Admitting: Internal Medicine

## 2023-03-15 ENCOUNTER — Encounter: Payer: Self-pay | Admitting: Internal Medicine

## 2023-03-15 VITALS — BP 116/70 | HR 62 | Temp 97.9°F | Ht 71.0 in | Wt 180.0 lb

## 2023-03-15 DIAGNOSIS — R10A1 Flank pain, right side: Secondary | ICD-10-CM | POA: Insufficient documentation

## 2023-03-15 DIAGNOSIS — R109 Unspecified abdominal pain: Secondary | ICD-10-CM

## 2023-03-15 DIAGNOSIS — R1031 Right lower quadrant pain: Secondary | ICD-10-CM

## 2023-03-15 DIAGNOSIS — K5901 Slow transit constipation: Secondary | ICD-10-CM

## 2023-03-15 DIAGNOSIS — K5900 Constipation, unspecified: Secondary | ICD-10-CM | POA: Diagnosis not present

## 2023-03-15 DIAGNOSIS — R10823 Right lower quadrant rebound abdominal tenderness: Secondary | ICD-10-CM

## 2023-03-15 DIAGNOSIS — R7989 Other specified abnormal findings of blood chemistry: Secondary | ICD-10-CM | POA: Diagnosis not present

## 2023-03-15 LAB — CBC WITH DIFFERENTIAL/PLATELET
Basophils Absolute: 0 10*3/uL (ref 0.0–0.1)
Basophils Relative: 0.6 % (ref 0.0–3.0)
Eosinophils Absolute: 0.1 10*3/uL (ref 0.0–0.7)
Eosinophils Relative: 2.6 % (ref 0.0–5.0)
HCT: 48.3 % (ref 39.0–52.0)
Hemoglobin: 16.1 g/dL (ref 13.0–17.0)
Lymphocytes Relative: 29.6 % (ref 12.0–46.0)
Lymphs Abs: 1.5 10*3/uL (ref 0.7–4.0)
MCHC: 33.4 g/dL (ref 30.0–36.0)
MCV: 91.9 fl (ref 78.0–100.0)
Monocytes Absolute: 0.3 10*3/uL (ref 0.1–1.0)
Monocytes Relative: 7 % (ref 3.0–12.0)
Neutro Abs: 3 10*3/uL (ref 1.4–7.7)
Neutrophils Relative %: 60.2 % (ref 43.0–77.0)
Platelets: 161 10*3/uL (ref 150.0–400.0)
RBC: 5.26 Mil/uL (ref 4.22–5.81)
RDW: 13 % (ref 11.5–15.5)
WBC: 5 10*3/uL (ref 4.0–10.5)

## 2023-03-15 LAB — BASIC METABOLIC PANEL
BUN: 18 mg/dL (ref 6–23)
CO2: 30 mEq/L (ref 19–32)
Calcium: 10.2 mg/dL (ref 8.4–10.5)
Chloride: 102 mEq/L (ref 96–112)
Creatinine, Ser: 1.33 mg/dL (ref 0.40–1.50)
GFR: 70.06 mL/min (ref >60.00)
Glucose, Bld: 99 mg/dL (ref 70–99)
Potassium: 4.3 mEq/L (ref 3.5–5.1)
Sodium: 141 mEq/L (ref 135–145)

## 2023-03-15 LAB — HEPATIC FUNCTION PANEL
ALT: 60 U/L — ABNORMAL HIGH (ref 0–53)
AST: 45 U/L — ABNORMAL HIGH (ref 0–37)
Albumin: 4.9 g/dL (ref 3.5–5.2)
Alkaline Phosphatase: 49 U/L (ref 39–117)
Bilirubin, Direct: 0.1 mg/dL (ref 0.0–0.3)
Total Bilirubin: 0.8 mg/dL (ref 0.2–1.2)
Total Protein: 7.4 g/dL (ref 6.0–8.3)

## 2023-03-15 LAB — TSH: TSH: 1.06 u[IU]/mL (ref 0.35–5.50)

## 2023-03-15 NOTE — Progress Notes (Signed)
Subjective:  Patient ID: [name] [name], male    DOB: 10/21/1990  Age: 32 y.o. MRN: [id number]  CC: Abdominal Pain   HPI ITZHAK BARTNIK presents for f/up ----  Discussed the use of AI scribe software for clinical note transcription with the patient, who gave verbal consent to proceed.  History of Present Illness   The patient, presented with a one-week history of abdominal pain. Initially, the pain was located near the belly button but over the past week, it has shifted lower and to the side. The patient described the pain as a dull, constant ache, and also noted the presence of a couple of lumps or knots in the area. Accompanying the abdominal pain, he reported experiencing cramps and fatigue.  In addition to the abdominal symptoms, he reported a day of difficulty urinating, describing it as painful but without any blood in the urine. He also reported constipation for the past two and a half weeks, requiring the use of stool softeners. His appetite has been generally good, except for the day of the consultation.  The patient denied any testicular pain or swelling, fever, chills, or vomiting. However, he did report some nausea and trouble sleeping due to fatigue.       Outpatient Medications Prior to Visit  Medication Sig Dispense Refill   Cholecalciferol (VITAMIN D3) 50 MCG (2000 UT) capsule Take 1 capsule (2,000 Units total) by mouth daily. 100 capsule 3   famotidine (PEPCID) 40 MG tablet Take 1 tablet (40 mg total) by mouth daily. 90 tablet 3   finasteride (PROSCAR) 5 MG tablet Take 1/4 tab daily 30 tablet 3   meloxicam (MOBIC) 15 MG tablet TAKE 1 TABLET BY MOUTH EVERY DAY 30 tablet 0   valACYclovir (VALTREX) 500 MG tablet Take 1 po bid x 5 days prn cold sores 40 tablet 2   No facility-administered medications prior to visit.    ROS Review of Systems  Constitutional: Negative.  Negative for appetite change, diaphoresis, fatigue and unexpected weight change.  HENT:  Negative.  Negative for trouble swallowing and voice change.   Respiratory:  Negative for cough, chest tightness, shortness of breath and wheezing.   Cardiovascular:  Negative for chest pain, palpitations and leg swelling.  Gastrointestinal:  Positive for abdominal pain and nausea. Negative for abdominal distention, blood in stool, constipation, diarrhea, rectal pain and vomiting.  Genitourinary:  Positive for dysuria. Negative for difficulty urinating, frequency, hematuria, penile swelling, scrotal swelling, testicular pain and urgency.  Musculoskeletal:  Positive for back pain.  Skin: Negative.  Negative for color change and rash.  Neurological:  Negative for dizziness, weakness and headaches.  Hematological:  Negative for adenopathy. Does not bruise/bleed easily.  Psychiatric/Behavioral: Negative.      Objective:  BP 116/70 (BP Location: Left Arm, Patient Position: Sitting, Cuff Size: Large)   Pulse 62   Temp 97.9 F (36.6 C) (Oral)   Ht 5\' 11"  (1.803 m)   Wt 180 lb (81.6 kg)   SpO2 96%   BMI 25.10 kg/m   BP Readings from Last 3 Encounters:  03/15/23 116/70  05/31/22 112/78  08/17/21 118/72    Wt Readings from Last 3 Encounters:  03/15/23 180 lb (81.6 kg)  05/31/22 199 lb (90.3 kg)  08/17/21 200 lb 12.8 oz (91.1 kg)    Physical Exam Vitals reviewed. Exam conducted with a chaperone present (His wife).  HENT:     Nose: Nose normal.     Mouth/Throat:     Mouth:  Mucous membranes are moist.  Eyes:     General: No scleral icterus.    Conjunctiva/sclera: Conjunctivae normal.  Cardiovascular:     Rate and Rhythm: Normal rate and regular rhythm.     Pulses: Normal pulses.     Heart sounds: No murmur heard.    No friction rub. No gallop.  Pulmonary:     Effort: Pulmonary effort is normal.     Breath sounds: No stridor. No wheezing, rhonchi or rales.  Abdominal:     General: Abdomen is flat. Bowel sounds are decreased. There is no distension.     Palpations: There is no  hepatomegaly, splenomegaly or mass.     Tenderness: There is abdominal tenderness in the right lower quadrant. There is no guarding or rebound.     Hernia: No hernia is present. There is no hernia in the left inguinal area or right inguinal area.  Genitourinary:    Penis: Normal and circumcised.      Testes: Normal.     Epididymis:     Right: Normal.     Left: Normal.     Prostate: Normal. Not enlarged, not tender and no nodules present.     Rectum: Normal. Guaiac result negative. No mass, tenderness, anal fissure, external hemorrhoid or internal hemorrhoid. Normal anal tone.  Musculoskeletal:        General: Normal range of motion.     Cervical back: Neck supple.     Right lower leg: No edema.     Left lower leg: No edema.  Lymphadenopathy:     Lower Body: No right inguinal adenopathy. No left inguinal adenopathy.  Skin:    General: Skin is warm and dry.     Findings: No rash.  Neurological:     General: No focal deficit present.     Mental Status: He is alert. Mental status is at baseline.     Lab Results  Component Value Date   WBC 5.0 03/15/2023   HGB 16.1 03/15/2023   HCT 48.3 03/15/2023   PLT 161.0 03/15/2023   GLUCOSE 99 03/15/2023   CHOL 132 11/10/2014   TRIG 120.0 11/10/2014   HDL 48.70 11/10/2014   LDLCALC 59 11/10/2014   ALT 60 (H) 03/15/2023   AST 45 (H) 03/15/2023   NA 141 03/15/2023   K 4.3 03/15/2023   CL 102 03/15/2023   CREATININE 1.33 03/15/2023   BUN 18 03/15/2023   CO2 30 03/15/2023   TSH 1.06 03/15/2023   INR 1.07 01/21/2010    DG Chest 1 View  Result Date: 06/03/2022 CLINICAL DATA:  Pre-employment exam. EXAM: CHEST  1 VIEW COMPARISON:  Dec 29, 2009 FINDINGS: The heart size and mediastinal contours are within normal limits. Both lungs are clear. The visualized skeletal structures are unremarkable. IMPRESSION: No active disease. Electronically Signed   By: Gerome Sam III M.D.   On: 06/03/2022 09:28   DG ABD ACUTE 2+V W 1V CHEST  Result  Date: 03/16/2023 CLINICAL DATA:  RLQ pain EXAM: DG ABDOMEN ACUTE WITH 1 VIEW CHEST COMPARISON:  CT scan abdomen and pelvis from 06/25/2015. FINDINGS: The bowel gas pattern is non-obstructive. There is moderate stool burden, including the ascending colon, compatible with colonic hypomotility. No evidence of pneumoperitoneum. No acute osseous abnormalities. The soft tissues are within normal limits. Bilateral lungs and lateral costophrenic angles are clear. Normal cardiomediastinal silhouette. Surgical changes, devices, tubes and lines: None. IMPRESSION: 1. Moderate stool burden, including the ascending colon, compatible with colonic hypomotility. 2. Otherwise  unremarkable exam. Electronically Signed   By: Jules Schick M.D.   On: 03/16/2023 08:38     Assessment & Plan:   Acute right flank pain -     Basic metabolic panel; Future -     CBC with Differential/Platelet; Future -     Urinalysis, Routine w reflex microscopic; Future -     Hepatic function panel; Future -     TSH; Future -     DG ABD ACUTE 2+V W 1V CHEST; Future -     CT ABDOMEN PELVIS W CONTRAST; Future  Right lower quadrant abdominal tenderness with rebound tenderness- Plain films were abnormal.  I recommended a CT of the abdomen and pelvis with contrast to evaluate for bowel obstruction, appendicitis, occult abscess, mass, and renal pathology. -     Basic metabolic panel; Future -     CBC with Differential/Platelet; Future -     Urinalysis, Routine w reflex microscopic; Future -     Hepatic function panel; Future -     DG ABD ACUTE 2+V W 1V CHEST; Future -     CT ABDOMEN PELVIS W CONTRAST; Future  Constipation, unspecified constipation type -     Basic metabolic panel; Future -     CBC with Differential/Platelet; Future -     Urinalysis, Routine w reflex microscopic; Future -     Hepatic function panel; Future -     TSH; Future -     Magnesium; Future  Elevated LFTs -     CT ABDOMEN PELVIS W CONTRAST; Future -     Hepatic  function panel; Future  Slow transit constipation -     Motegrity; Take 1 tablet by mouth daily.  Dispense: 90 tablet; Refill: 0 -     Magnesium; Future     Follow-up: Return in about 3 months (around 06/15/2023).  Sanda Linger, MD

## 2023-03-16 ENCOUNTER — Ambulatory Visit (INDEPENDENT_AMBULATORY_CARE_PROVIDER_SITE_OTHER): Payer: 59

## 2023-03-16 ENCOUNTER — Encounter: Payer: Self-pay | Admitting: Internal Medicine

## 2023-03-16 ENCOUNTER — Ambulatory Visit
Admission: RE | Admit: 2023-03-16 | Discharge: 2023-03-16 | Disposition: A | Discharge status: Home / Self Care | Payer: 59 | Source: Ambulatory Visit | Attending: Internal Medicine | Admitting: Internal Medicine

## 2023-03-16 DIAGNOSIS — R109 Unspecified abdominal pain: Secondary | ICD-10-CM

## 2023-03-16 DIAGNOSIS — R7989 Other specified abnormal findings of blood chemistry: Secondary | ICD-10-CM

## 2023-03-16 DIAGNOSIS — R10A1 Flank pain, right side: Secondary | ICD-10-CM

## 2023-03-16 DIAGNOSIS — K5900 Constipation, unspecified: Secondary | ICD-10-CM | POA: Insufficient documentation

## 2023-03-16 DIAGNOSIS — R10823 Right lower quadrant rebound abdominal tenderness: Secondary | ICD-10-CM | POA: Diagnosis not present

## 2023-03-16 LAB — MAGNESIUM: Magnesium: 2.3 mg/dL (ref 1.5–2.5)

## 2023-03-16 LAB — URINALYSIS, ROUTINE W REFLEX MICROSCOPIC
Bilirubin Urine: NEGATIVE
Hgb urine dipstick: NEGATIVE
Ketones, ur: NEGATIVE
Leukocytes,Ua: NEGATIVE
Nitrite: NEGATIVE
RBC / HPF: NONE SEEN (ref 0–?)
Specific Gravity, Urine: >=1.03 — AB (ref 1.000–1.030)
Total Protein, Urine: NEGATIVE
Urine Glucose: NEGATIVE
Urobilinogen, UA: 0.2 (ref 0.0–1.0)
pH: 5.5 (ref 5.0–8.0)

## 2023-03-16 LAB — HEPATIC FUNCTION PANEL
ALT: 54 U/L — ABNORMAL HIGH (ref 0–53)
AST: 34 U/L (ref 0–37)
Albumin: 4.7 g/dL (ref 3.5–5.2)
Alkaline Phosphatase: 47 U/L (ref 39–117)
Bilirubin, Direct: 0.2 mg/dL (ref 0.0–0.3)
Total Bilirubin: 0.9 mg/dL (ref 0.2–1.2)
Total Protein: 7 g/dL (ref 6.0–8.3)

## 2023-03-16 MED ORDER — IOPAMIDOL (ISOVUE-300) INJECTION 61%
100.0000 mL | Freq: Once | INTRAVENOUS | Status: AC | PRN
  Administered 2023-03-16: 100 mL via INTRAVENOUS

## 2023-03-16 MED ORDER — MOTEGRITY 1 MG PO TABS
1.0000 | ORAL_TABLET | Freq: Every day | ORAL | 0 refills | Status: AC
Start: 2023-03-16 — End: ?

## 2023-03-16 NOTE — Patient Instructions (Signed)

## 2023-03-17 ENCOUNTER — Encounter: Payer: Self-pay | Admitting: Internal Medicine

## 2023-03-17 ENCOUNTER — Other Ambulatory Visit (HOSPITAL_COMMUNITY): Payer: Self-pay

## 2023-03-17 ENCOUNTER — Telehealth: Payer: Self-pay

## 2023-03-17 NOTE — Telephone Encounter (Signed)
Pharmacy Patient Advocate Encounter   Received notification from Patient Advice Request messages that prior authorization for Motegrity 1MG  tablets is required/requested.   Insurance verification completed.   The patient is insured through [facility] .   Per test claim: PA submitted to [facility] via CoverMyMeds Key/confirmation #/EOC [id number] Status is pending

## 2023-03-17 NOTE — Telephone Encounter (Signed)
Pharmacy Patient Advocate Encounter  Received notification from [facility] that Prior Authorization for Motegrity 1MG  tablets has been DENIED because patient does not have a history of failure, contraindication or intolerance to BOTH Linzess and Lubiprostone (Amitiza).Marland Kitchen  PA #/Case ID/Reference #: [id number]  Please be advised we currently do not have a Pharmacist to review denials, therefore you will need to process appeals accordingly as needed. Thanks for your support at this time. Contact for appeals are as follows: Phone: [phone], Fax: [phone]

## 2023-03-20 ENCOUNTER — Other Ambulatory Visit: Payer: Self-pay | Admitting: Internal Medicine

## 2023-03-20 ENCOUNTER — Ambulatory Visit: Payer: Self-pay | Admitting: Family Medicine

## 2023-03-20 DIAGNOSIS — K5901 Slow transit constipation: Secondary | ICD-10-CM

## 2023-03-20 DIAGNOSIS — K5904 Chronic idiopathic constipation: Secondary | ICD-10-CM

## 2023-03-20 MED ORDER — LUBIPROSTONE 24 MCG PO CAPS
24.0000 ug | ORAL_CAPSULE | Freq: Two times a day (BID) | ORAL | 0 refills | Status: DC
Start: 2023-03-20 — End: 2023-03-22

## 2023-03-22 ENCOUNTER — Other Ambulatory Visit: Payer: Self-pay

## 2023-03-22 DIAGNOSIS — K5904 Chronic idiopathic constipation: Secondary | ICD-10-CM

## 2023-03-22 MED ORDER — LUBIPROSTONE 24 MCG PO CAPS
24.0000 ug | ORAL_CAPSULE | Freq: Two times a day (BID) | ORAL | 0 refills | Status: DC
Start: 2023-03-22 — End: 2023-09-21

## 2023-03-22 NOTE — Telephone Encounter (Signed)
Patient called to check on the status of the prior authorization. He was informed of the result. He would like to know what Dr. Yetta Barre would like to do next. Patient would like a call back at [phone].

## 2023-03-27 ENCOUNTER — Telehealth: Payer: Self-pay

## 2023-03-27 ENCOUNTER — Other Ambulatory Visit (HOSPITAL_COMMUNITY): Payer: Self-pay

## 2023-03-27 NOTE — Telephone Encounter (Signed)
Pharmacy Patient Advocate Encounter  Received notification from [facility] that Prior Authorization for Lubiprostone caps has been APPROVED from 03/24/23 to 03/23/24. Ran test claim, Copay is $refill too soon.  PA #/Case ID/Reference #: [id number]

## 2023-03-29 ENCOUNTER — Ambulatory Visit: Payer: 59 | Admitting: Internal Medicine

## 2023-03-29 ENCOUNTER — Encounter: Payer: Self-pay | Admitting: Internal Medicine

## 2023-03-29 VITALS — BP 118/60 | HR 58 | Temp 98.6°F | Ht 71.0 in | Wt 183.0 lb

## 2023-03-29 DIAGNOSIS — M549 Dorsalgia, unspecified: Secondary | ICD-10-CM | POA: Insufficient documentation

## 2023-03-29 DIAGNOSIS — M545 Low back pain, unspecified: Secondary | ICD-10-CM | POA: Diagnosis not present

## 2023-03-29 DIAGNOSIS — K5900 Constipation, unspecified: Secondary | ICD-10-CM

## 2023-03-29 DIAGNOSIS — Z0000 Encounter for general adult medical examination without abnormal findings: Secondary | ICD-10-CM

## 2023-03-29 DIAGNOSIS — J4521 Mild intermittent asthma with (acute) exacerbation: Secondary | ICD-10-CM

## 2023-03-29 DIAGNOSIS — G8929 Other chronic pain: Secondary | ICD-10-CM

## 2023-03-29 NOTE — Patient Instructions (Signed)
Dissolve Miralax 4-6 scoops in a bottle of water - drink over 2-4 hrs Take 2 Dulcolax tablets twice a day if needed

## 2023-03-29 NOTE — Assessment & Plan Note (Signed)
Dissolve Miralax 4-6 scoops in a bottle of water - drink over 2-4 hrs Take 2 Dulcolax tablets twice a day if needed

## 2023-03-29 NOTE — Assessment & Plan Note (Addendum)
Work related MSK pain - R side Start yoga Start PT He was given meloxicam in the past.  Okay to use as needed

## 2023-03-29 NOTE — Progress Notes (Signed)
Subjective:  Patient ID: [name] [name], male    DOB: March 04, 1991  Age: 32 y.o. MRN: [id number]  CC: No chief complaint on file.   HPI [name] [name] presents for R abd pain, constipation.  He had abdominal CT scan done on 03/16/2023.  It was normal.  Complaint of low back pain-chronic Pt lost 20 lbs in 4 month, working 4th shift  Outpatient Medications Prior to Visit  Medication Sig Dispense Refill   Cholecalciferol (VITAMIN D3) 50 MCG (2000 UT) capsule Take 1 capsule (2,000 Units total) by mouth daily. 100 capsule 3   famotidine (PEPCID) 40 MG tablet Take 1 tablet (40 mg total) by mouth daily. 90 tablet 3   finasteride (PROSCAR) 5 MG tablet Take 1/4 tab daily 30 tablet 3   lubiprostone (AMITIZA) 24 MCG capsule Take 1 capsule (24 mcg total) by mouth 2 (two) times daily with a meal. 180 capsule 0   meloxicam (MOBIC) 15 MG tablet TAKE 1 TABLET BY MOUTH EVERY DAY 30 tablet 0   valACYclovir (VALTREX) 500 MG tablet Take 1 po bid x 5 days prn cold sores 40 tablet 2   No facility-administered medications prior to visit.    ROS: Review of Systems  Constitutional:  Negative for appetite change, fatigue and unexpected weight change.  HENT:  Negative for congestion, nosebleeds, sneezing, sore throat and trouble swallowing.   Eyes:  Negative for itching and visual disturbance.  Respiratory:  Negative for cough.   Cardiovascular:  Negative for chest pain, palpitations and leg swelling.  Gastrointestinal:  Negative for abdominal distention, blood in stool, diarrhea and nausea.  Genitourinary:  Negative for frequency and hematuria.  Musculoskeletal:  Positive for back pain. Negative for gait problem, joint swelling and neck pain.  Skin:  Negative for rash.  Neurological:  Negative for dizziness, tremors, speech difficulty and weakness.  Psychiatric/Behavioral:  Negative for agitation, dysphoric mood and sleep disturbance. The patient is not nervous/anxious.     Objective:  BP 118/60 (BP  Location: Left Arm, Patient Position: Sitting, Cuff Size: Large)   Pulse (!) 58   Temp 98.6 F (37 C) (Oral)   Ht 5\' 11"  (1.803 m)   Wt 183 lb (83 kg)   SpO2 97%   BMI 25.52 kg/m   BP Readings from Last 3 Encounters:  03/29/23 118/60  03/15/23 116/70  05/31/22 112/78    Wt Readings from Last 3 Encounters:  03/29/23 183 lb (83 kg)  03/15/23 180 lb (81.6 kg)  05/31/22 199 lb (90.3 kg)    Physical Exam Constitutional:      General: He is not in acute distress.    Appearance: He is well-developed.     Comments: NAD  Eyes:     Conjunctiva/sclera: Conjunctivae normal.     Pupils: Pupils are equal, round, and reactive to light.  Neck:     Thyroid: No thyromegaly.     Vascular: No JVD.  Cardiovascular:     Rate and Rhythm: Normal rate and regular rhythm.     Heart sounds: Normal heart sounds. No murmur heard.    No friction rub. No gallop.  Pulmonary:     Effort: Pulmonary effort is normal. No respiratory distress.     Breath sounds: Normal breath sounds. No wheezing or rales.  Chest:     Chest wall: No tenderness.  Abdominal:     General: Bowel sounds are normal. There is no distension.     Palpations: Abdomen is soft. There is no mass.  Tenderness: There is no abdominal tenderness. There is no guarding or rebound.  Musculoskeletal:        General: No tenderness. Normal range of motion.     Cervical back: Normal range of motion.  Lymphadenopathy:     Cervical: No cervical adenopathy.  Skin:    General: Skin is warm and dry.     Findings: No rash.  Neurological:     Mental Status: He is alert and oriented to person, place, and time.     Cranial Nerves: No cranial nerve deficit.     Motor: No abnormal muscle tone.     Coordination: Coordination normal.     Gait: Gait normal.     Deep Tendon Reflexes: Reflexes are normal and symmetric.  Psychiatric:        Behavior: Behavior normal.        Thought Content: Thought content normal.        Judgment: Judgment  normal.   The patient looks well  Lab Results  Component Value Date   WBC 5.0 03/15/2023   HGB 16.1 03/15/2023   HCT 48.3 03/15/2023   PLT 161.0 03/15/2023   GLUCOSE 99 03/15/2023   CHOL 132 11/10/2014   TRIG 120.0 11/10/2014   HDL 48.70 11/10/2014   LDLCALC 59 11/10/2014   ALT 54 (H) 03/16/2023   AST 34 03/16/2023   NA 141 03/15/2023   K 4.3 03/15/2023   CL 102 03/15/2023   CREATININE 1.33 03/15/2023   BUN 18 03/15/2023   CO2 30 03/15/2023   TSH 1.06 03/15/2023   INR 1.07 01/21/2010    CT ABDOMEN PELVIS W CONTRAST  Result Date: 03/16/2023 CLINICAL DATA:  Right lower quadrant abdominal pain and constipation for 2 weeks EXAM: CT ABDOMEN AND PELVIS WITH CONTRAST TECHNIQUE: Multidetector CT imaging of the abdomen and pelvis was performed using the standard protocol following bolus administration of intravenous contrast. RADIATION DOSE REDUCTION: This exam was performed according to the departmental dose-optimization program which includes automated exposure control, adjustment of the mA and/or kV according to patient size and/or use of iterative reconstruction technique. CONTRAST:  ISOVUE-300 IOPAMIDOL (ISOVUE-300) INJECTION 61% COMPARISON:  06/25/2015 FINDINGS: Lower chest: No acute abnormality. Hepatobiliary: No solid liver abnormality is seen. No gallstones, gallbladder wall thickening, or biliary dilatation. Pancreas: Unremarkable. No pancreatic ductal dilatation or surrounding inflammatory changes. Spleen: Normal in size without significant abnormality. Adrenals/Urinary Tract: Adrenal glands are unremarkable. Kidneys are normal, without renal calculi, solid lesion, or hydronephrosis. Bladder is unremarkable. Stomach/Bowel: Stomach is within normal limits. Appendix appears normal. No evidence of bowel wall thickening, distention, or inflammatory changes. Vascular/Lymphatic: No significant vascular findings are present. No enlarged abdominal or pelvic lymph nodes. Reproductive: No  mass or other significant abnormality. Other: No abdominal wall hernia or abnormality. No ascites. Musculoskeletal: No acute or significant osseous findings. IMPRESSION: No acute CT findings of the abdomen or pelvis to explain right lower quadrant pain. Normal appendix. Electronically Signed   By: Jearld Lesch M.D.   On: 03/16/2023 12:15   DG ABD ACUTE 2+V W 1V CHEST  Result Date: 03/16/2023 CLINICAL DATA:  RLQ pain EXAM: DG ABDOMEN ACUTE WITH 1 VIEW CHEST COMPARISON:  CT scan abdomen and pelvis from 06/25/2015. FINDINGS: The bowel gas pattern is non-obstructive. There is moderate stool burden, including the ascending colon, compatible with colonic hypomotility. No evidence of pneumoperitoneum. No acute osseous abnormalities. The soft tissues are within normal limits. Bilateral lungs and lateral costophrenic angles are clear. Normal cardiomediastinal silhouette. Surgical changes,  devices, tubes and lines: None. IMPRESSION: 1. Moderate stool burden, including the ascending colon, compatible with colonic hypomotility. 2. Otherwise unremarkable exam. Electronically Signed   By: Jules Schick M.D.   On: 03/16/2023 08:38    Assessment & Plan:   Problem List Items Addressed This Visit     Asthma    No relapse      Well adult exam   Relevant Orders   TSH   Urinalysis   CBC with Differential/Platelet   Lipid panel   Comprehensive metabolic panel   Constipation    Dissolve Miralax 4-6 scoops in a bottle of water - drink over 2-4 hrs Take 2 Dulcolax tablets twice a day if needed        Back pain - Primary    Work related MSK pain - R side Start yoga Start PT He was given meloxicam in the past.  Okay to use as needed        Relevant Orders   Ambulatory referral to Physical Therapy   Vitamin B12   VITAMIN D 25 Hydroxy (Vit-D Deficiency, Fractures)      No orders of the defined types were placed in this encounter.     Follow-up: Return in about 3 months (around 06/29/2023) for a  follow-up visit.  Sonda Primes, MD

## 2023-04-15 NOTE — Assessment & Plan Note (Signed)
No relapse 

## 2023-07-02 ENCOUNTER — Other Ambulatory Visit: Payer: Self-pay

## 2023-07-02 ENCOUNTER — Emergency Department (HOSPITAL_COMMUNITY): Payer: No Typology Code available for payment source

## 2023-07-02 ENCOUNTER — Encounter (HOSPITAL_COMMUNITY): Payer: Self-pay

## 2023-07-02 ENCOUNTER — Emergency Department (HOSPITAL_COMMUNITY)
Admission: EM | Admit: 2023-07-02 | Discharge: 2023-07-02 | Disposition: A | Discharge status: Home / Self Care | Payer: No Typology Code available for payment source | Attending: Emergency Medicine | Admitting: Emergency Medicine

## 2023-07-02 DIAGNOSIS — M25562 Pain in left knee: Secondary | ICD-10-CM | POA: Insufficient documentation

## 2023-07-02 DIAGNOSIS — W010XXA Fall on same level from slipping, tripping and stumbling without subsequent striking against object, initial encounter: Secondary | ICD-10-CM | POA: Insufficient documentation

## 2023-07-02 DIAGNOSIS — Z23 Encounter for immunization: Secondary | ICD-10-CM | POA: Insufficient documentation

## 2023-07-02 DIAGNOSIS — J45909 Unspecified asthma, uncomplicated: Secondary | ICD-10-CM | POA: Insufficient documentation

## 2023-07-02 DIAGNOSIS — M25532 Pain in left wrist: Secondary | ICD-10-CM | POA: Diagnosis present

## 2023-07-02 DIAGNOSIS — W19XXXA Unspecified fall, initial encounter: Secondary | ICD-10-CM

## 2023-07-02 MED ORDER — TETANUS-DIPHTH-ACELL PERTUSSIS 5-2.5-18.5 LF-MCG/0.5 IM SUSY
0.5000 mL | PREFILLED_SYRINGE | Freq: Once | INTRAMUSCULAR | Status: AC
  Administered 2023-07-02: 0.5 mL via INTRAMUSCULAR
  Filled 2023-07-02: qty 0.5

## 2023-07-02 NOTE — Discharge Instructions (Addendum)
As we discussed, your workup in the ER today was reassuring for acute findings.  X-ray imaging of your wrist and knee did not reveal any fracture or dislocation.  I recommend that you clean out your knee wound thoroughly in the shower with soap and water and apply dressing.  Please rest, ice, compress, and elevate areas that are sore and take Tylenol/ibuprofen as needed for pain.  You may be more sore tomorrow and that is to be expected.  If in the next few days you are still having discomfort then I recommend that you follow-up with orthopedics.  I have given you a referral with a number to call to schedule an appointment.  Return if development of any new or worsening symptoms.
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
## 2023-09-06 ENCOUNTER — Encounter: Payer: Self-pay | Admitting: Internal Medicine

## 2023-09-08 ENCOUNTER — Ambulatory Visit: Payer: 59 | Admitting: Family Medicine

## 2023-09-08 NOTE — Telephone Encounter (Signed)
 Spoke with pt and pt has stated he went in to the UC to be tx'd.

## 2023-09-13 ENCOUNTER — Ambulatory Visit: Payer: 59 | Admitting: Internal Medicine
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
## 2023-09-21 ENCOUNTER — Encounter: Payer: Self-pay | Admitting: Surgery

## 2023-09-21 ENCOUNTER — Ambulatory Visit: Payer: 59 | Admitting: Surgery

## 2023-09-21 VITALS — BP 102/70 | HR 88 | Temp 98.2°F | Ht 71.0 in | Wt 183.0 lb

## 2023-09-21 DIAGNOSIS — R1031 Right lower quadrant pain: Secondary | ICD-10-CM

## 2023-09-21 DIAGNOSIS — G8929 Other chronic pain: Secondary | ICD-10-CM | POA: Diagnosis not present

## 2023-09-21 NOTE — Patient Instructions (Addendum)
We recommend taking a daily fiber supplement like Metamucil or Benefiber.   Advised to pursue a goal of 25 to 30 g of fiber daily.  Made aware that the majority of this may be through natural sources, but advised to be aware of actual consumption and to ensure minimal consumption by daily supplementation.  Various forms of supplements discussed.  Recommended Psyllium husk(Metamucil), that mixes well with water. Strongly advised to consume more fluids to ensure adequate hydration, instructed to watch color of urine to determine adequacy of hydration.  Clarity is pursued in urine output, and bowel activity that correlates to significant meal intake.   We need to avoid deferring having bowel movements, advised to take the time at the first sign of sensation, typically following meals, and in the morning.   Subsequent utilization of MiraLAX may be needed ensure at least daily movement, ideally twice daily bowel movements. Never skip a day...   To be regular, we must do the above EVERY day.    We will get you scheduled for a right groin ultrasound to better assess the area.  You are scheduled for an Ultrasound at [facility] on 09/26/23. You will need to arrive there at 2:45 pm. [address]   We will call you with your results of the Ultrasound and your next steps.     Please call and ask to speak with a nurse if you develop questions or concerns.

## 2023-09-26 ENCOUNTER — Ambulatory Visit
Admission: RE | Admit: 2023-09-26 | Discharge: 2023-09-26 | Disposition: A | Discharge status: Home / Self Care | Payer: 59 | Source: Ambulatory Visit | Attending: Surgery | Admitting: Surgery

## 2023-09-26 DIAGNOSIS — G8929 Other chronic pain: Secondary | ICD-10-CM

## 2024-01-23 ENCOUNTER — Ambulatory Visit: Admitting: Internal Medicine

## 2024-01-23 VITALS — BP 110/62 | HR 76 | Ht 71.0 in | Wt 190.0 lb

## 2024-01-23 DIAGNOSIS — H5461 Unqualified visual loss, right eye, normal vision left eye: Secondary | ICD-10-CM | POA: Diagnosis not present

## 2024-01-23 DIAGNOSIS — G4489 Other headache syndrome: Secondary | ICD-10-CM | POA: Diagnosis not present

## 2024-01-23 DIAGNOSIS — K219 Gastro-esophageal reflux disease without esophagitis: Secondary | ICD-10-CM

## 2024-01-23 DIAGNOSIS — J309 Allergic rhinitis, unspecified: Secondary | ICD-10-CM | POA: Diagnosis not present

## 2024-01-23 MED ORDER — FEXOFENADINE-PSEUDOEPHED ER 180-240 MG PO TB24: 1.0000 | ORAL_TABLET | Freq: Every day | ORAL | 1 refills | Status: AC

## 2024-01-23 MED ORDER — ASPIRIN 81 MG PO TBEC: 81.0000 mg | DELAYED_RELEASE_TABLET | Freq: Every day | ORAL | Status: AC

## 2024-01-23 MED ORDER — RIZATRIPTAN BENZOATE 10 MG PO TABS: 10.0000 mg | ORAL_TABLET | Freq: Once | ORAL | 5 refills | Status: AC | PRN

## 2024-01-23 MED ORDER — FAMOTIDINE 40 MG PO TABS: 40.0000 mg | ORAL_TABLET | Freq: Every day | ORAL | 3 refills | Status: AC

## 2024-01-23 NOTE — Progress Notes (Unsigned)
 Subjective:  Patient ID: [name] [name], male    DOB: 1991-03-31  Age: 33 y.o. MRN: [id number]  CC: Blurred Vision (Blurred/dim vision in right eye. Small headache (sharp pain in the middle of the forehead, feels like sinus headache but deeper). Bouts of disorientation "floating". All symptoms intermittent for the past few weeks. Believes it might be related to allergies (sneezing with clear sputum))   HPI [name] [name] presents for vision loss episodes, HA, nausea  Outpatient Medications Prior to Visit  Medication Sig Dispense Refill  . levocetirizine (XYZAL) 5 MG tablet Take 5 mg by mouth every evening.    . fexofenadine-pseudoephedrine (ALLEGRA-D 24) 180-240 MG 24 hr tablet Take 1 tablet by mouth daily.    . famotidine  (PEPCID ) 40 MG tablet Take 1 tablet (40 mg total) by mouth daily. (Patient not taking: Reported on 01/23/2024) 90 tablet 3   No facility-administered medications prior to visit.    ROS: Review of Systems  Objective:  BP 110/62   Pulse 76   Ht 5\' 11"  (1.803 m)   Wt 190 lb (86.2 kg)   SpO2 97%   BMI 26.50 kg/m   BP Readings from Last 3 Encounters:  01/23/24 110/62  09/21/23 102/70  07/02/23 132/82    Wt Readings from Last 3 Encounters:  01/23/24 190 lb (86.2 kg)  09/21/23 183 lb (83 kg)  07/02/23 180 lb (81.6 kg)    Physical Exam  Lab Results  Component Value Date   WBC 5.0 03/15/2023   HGB 16.1 03/15/2023   HCT 48.3 03/15/2023   PLT 161.0 03/15/2023   GLUCOSE 99 03/15/2023   CHOL 132 11/10/2014   TRIG 120.0 11/10/2014   HDL 48.70 11/10/2014   LDLCALC 59 11/10/2014   ALT 54 (H) 03/16/2023   AST 34 03/16/2023   NA 141 03/15/2023   K 4.3 03/15/2023   CL 102 03/15/2023   CREATININE 1.33 03/15/2023   BUN 18 03/15/2023   CO2 30 03/15/2023   TSH 1.06 03/15/2023   INR 1.07 01/21/2010    US  PELVIS LIMITED (TRANSABDOMINAL ONLY) Result Date: 09/26/2023 : PROCEDURE: US  PELVIS LIMITED HISTORY: Patient is a 33 y/o  M with right inguinal  hernia. COMPARISON: . TECHNIQUE: Two-dimensional grayscale and color Doppler ultrasound of the right lower quadrant was performed. FINDINGS: No distinct masses are identified within the right lower quadrant, area of concern. No focal fluid collections are demonstrated. No sonographic evidence of hernia. IMPRESSION: 1. No sonographic abnormalities are identified within the right lower quadrant, region of concern. No sonographic evidence of hernia. Thank you for allowing us  to assist in the care of this patient. Electronically Signed   By: Beula Brunswick M.D.   On: 09/26/2023 22:41    Assessment & Plan:   Problem List Items Addressed This Visit   None Visit Diagnoses       Vision loss of right eye    -  Primary   Relevant Orders   Ambulatory referral to Ophthalmology         Meds ordered this encounter  Medications  . rizatriptan (MAXALT) 10 MG tablet    Sig: Take 1 tablet (10 mg total) by mouth once as needed for up to 1 dose for migraine. May repeat in 2 hours if needed    Dispense:  12 tablet    Refill:  5  . famotidine  (PEPCID ) 40 MG tablet    Sig: Take 1 tablet (40 mg total) by mouth daily.    Dispense:  90 tablet    Refill:  3  . fexofenadine-pseudoephedrine (ALLEGRA-D 24) 180-240 MG 24 hr tablet    Sig: Take 1 tablet by mouth daily.    Dispense:  90 tablet    Refill:  1  . aspirin EC 81 MG tablet    Sig: Take 1 tablet (81 mg total) by mouth daily.      Follow-up: Return in about 3 months (around 04/24/2024) for a follow-up visit.  Anitra Barn, MD

## 2024-01-24 ENCOUNTER — Encounter: Payer: Self-pay | Admitting: Internal Medicine

## 2024-01-24 DIAGNOSIS — J309 Allergic rhinitis, unspecified: Secondary | ICD-10-CM | POA: Insufficient documentation

## 2024-01-24 DIAGNOSIS — H5461 Unqualified visual loss, right eye, normal vision left eye: Secondary | ICD-10-CM | POA: Insufficient documentation

## 2024-01-24 DIAGNOSIS — R519 Headache, unspecified: Secondary | ICD-10-CM | POA: Insufficient documentation

## 2024-01-24 NOTE — Assessment & Plan Note (Addendum)
 New occasional episodes of blurred/dim vision in right eye.  The episodes usually start upon awakening 1-2 times a week and last for about 30-90 minutes and followed by a headache over the right eye - sharp severe pain in the middle of the forehead, feels like sinus headache but deeper. Bouts of disorientation "floating" in the beginning of the episode - all symptoms intermittent for the past few weeks. Believes it might be related to allergies (sneezing with clear sputum). Episodes are accompanied by nausea and fatigue.  They may be triggered by weather change, stress, night.  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  Take ibuprofen  as needed.  Continue with Xyzal for allergies; Allegra-D 24-hour as needed.  Start baby aspirin empirically.  Ophthalmology appointment as soon as possible

## 2024-01-24 NOTE — Assessment & Plan Note (Signed)
 Continue with Xyzal daily.  She can take Allegra-D 24-hour as needed-prescription provided

## 2024-01-24 NOTE — Assessment & Plan Note (Signed)
Continue with famotidine 40 mg daily

## 2024-01-24 NOTE — Assessment & Plan Note (Addendum)
 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 Prescribe Maxalt  as needed.  Take ibuprofen  as needed.  Continue with Xyzal for allergies; Allegra-D 24-hour as needed.  Start baby aspirin  empirically.  Ophthalmology appointment as soon as possible

## 2024-01-27 ENCOUNTER — Encounter: Payer: Self-pay | Admitting: Internal Medicine

## 2024-05-20 ENCOUNTER — Other Ambulatory Visit: Payer: Self-pay | Admitting: Nurse Practitioner

## 2024-05-20 ENCOUNTER — Ambulatory Visit
Admission: RE | Admit: 2024-05-20 | Discharge: 2024-05-20 | Disposition: A | Discharge status: Home / Self Care | Source: Ambulatory Visit | Attending: Nurse Practitioner | Admitting: Nurse Practitioner

## 2024-05-20 DIAGNOSIS — T1490XA Injury, unspecified, initial encounter: Secondary | ICD-10-CM
